# Patient Record
Sex: Male | Born: 1990 | Race: White | Hispanic: No | Marital: Single | Smoking: Former smoker
Health system: Southern US, Community
[De-identification: ages and names within clinical notes are randomized; demographics above are authoritative.]

## PROBLEM LIST (undated history)

## (undated) DIAGNOSIS — I10 Essential (primary) hypertension: Secondary | ICD-10-CM

---
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
## 2018-06-28 ENCOUNTER — Ambulatory Visit (HOSPITAL_COMMUNITY)
Admission: RE | Admit: 2018-06-28 | Discharge: 2018-06-28 | Disposition: A | Discharge status: Home / Self Care | Payer: Self-pay | Attending: Psychiatry | Admitting: Psychiatry

## 2018-06-28 DIAGNOSIS — Z1330 Encounter for screening examination for mental health and behavioral disorders, unspecified: Secondary | ICD-10-CM | POA: Insufficient documentation

## 2018-06-29 ENCOUNTER — Other Ambulatory Visit: Payer: Self-pay

## 2018-06-29 ENCOUNTER — Encounter (HOSPITAL_COMMUNITY): Payer: Self-pay | Admitting: *Deleted

## 2018-06-29 ENCOUNTER — Inpatient Hospital Stay (HOSPITAL_COMMUNITY)
Admission: AD | Admit: 2018-06-29 | Discharge: 2018-07-02 | DRG: 885 | Disposition: A | Discharge status: Home / Self Care | Payer: Federal, State, Local not specified - Other | Attending: Psychiatry | Admitting: Psychiatry

## 2018-06-29 DIAGNOSIS — F4310 Post-traumatic stress disorder, unspecified: Secondary | ICD-10-CM

## 2018-06-29 DIAGNOSIS — I10 Essential (primary) hypertension: Secondary | ICD-10-CM | POA: Diagnosis present

## 2018-06-29 DIAGNOSIS — Z23 Encounter for immunization: Secondary | ICD-10-CM

## 2018-06-29 DIAGNOSIS — Z915 Personal history of self-harm: Secondary | ICD-10-CM

## 2018-06-29 DIAGNOSIS — Z888 Allergy status to other drugs, medicaments and biological substances status: Secondary | ICD-10-CM | POA: Diagnosis not present

## 2018-06-29 DIAGNOSIS — F17210 Nicotine dependence, cigarettes, uncomplicated: Secondary | ICD-10-CM | POA: Diagnosis present

## 2018-06-29 DIAGNOSIS — F314 Bipolar disorder, current episode depressed, severe, without psychotic features: Secondary | ICD-10-CM | POA: Diagnosis present

## 2018-06-29 DIAGNOSIS — F1920 Other psychoactive substance dependence, uncomplicated: Secondary | ICD-10-CM | POA: Diagnosis not present

## 2018-06-29 DIAGNOSIS — R45851 Suicidal ideations: Secondary | ICD-10-CM | POA: Diagnosis present

## 2018-06-29 DIAGNOSIS — F419 Anxiety disorder, unspecified: Secondary | ICD-10-CM | POA: Diagnosis not present

## 2018-06-29 DIAGNOSIS — G4700 Insomnia, unspecified: Secondary | ICD-10-CM | POA: Diagnosis present

## 2018-06-29 LAB — URINALYSIS, ROUTINE W REFLEX MICROSCOPIC
BILIRUBIN URINE: NEGATIVE
Bacteria, UA: NONE SEEN
GLUCOSE, UA: NEGATIVE mg/dL
KETONES UR: NEGATIVE mg/dL
LEUKOCYTES UA: NEGATIVE
NITRITE: NEGATIVE
PH: 5 (ref 5.0–8.0)
Protein, ur: NEGATIVE mg/dL
Specific Gravity, Urine: 1.025 (ref 1.005–1.030)

## 2018-06-29 LAB — COMPREHENSIVE METABOLIC PANEL
ALK PHOS: 55 U/L (ref 38–126)
ALT: 32 U/L (ref 0–44)
AST: 32 U/L (ref 15–41)
Albumin: 3.8 g/dL (ref 3.5–5.0)
Anion gap: 8 (ref 5–15)
BUN: 9 mg/dL (ref 6–20)
CO2: 27 mmol/L (ref 22–32)
CREATININE: 0.66 mg/dL (ref 0.61–1.24)
Calcium: 9.3 mg/dL (ref 8.9–10.3)
Chloride: 106 mmol/L (ref 98–111)
Glucose, Bld: 104 mg/dL — ABNORMAL HIGH (ref 70–99)
Potassium: 3.8 mmol/L (ref 3.5–5.1)
Sodium: 141 mmol/L (ref 135–145)
Total Bilirubin: 0.7 mg/dL (ref 0.3–1.2)
Total Protein: 6.5 g/dL (ref 6.5–8.1)

## 2018-06-29 LAB — CBC
HCT: 48.3 % (ref 39.0–52.0)
Hemoglobin: 15.9 g/dL (ref 13.0–17.0)
MCH: 29.8 pg (ref 26.0–34.0)
MCHC: 32.9 g/dL (ref 30.0–36.0)
MCV: 90.4 fL (ref 80.0–100.0)
PLATELETS: 251 10*3/uL (ref 150–400)
RBC: 5.34 MIL/uL (ref 4.22–5.81)
RDW: 13 % (ref 11.5–15.5)
WBC: 8 10*3/uL (ref 4.0–10.5)
nRBC: 0 % (ref 0.0–0.2)

## 2018-06-29 LAB — HEMOGLOBIN A1C
Hgb A1c MFr Bld: 5.2 % (ref 4.8–5.6)
MEAN PLASMA GLUCOSE: 102.54 mg/dL

## 2018-06-29 LAB — LIPID PANEL
CHOLESTEROL: 145 mg/dL (ref 0–200)
HDL: 34 mg/dL — ABNORMAL LOW (ref >40)
LDL Cholesterol: 81 mg/dL (ref 0–99)
Total CHOL/HDL Ratio: 4.3 RATIO
Triglycerides: 149 mg/dL (ref <150)
VLDL: 30 mg/dL (ref 0–40)

## 2018-06-29 LAB — RAPID URINE DRUG SCREEN, HOSP PERFORMED
Amphetamines: NOT DETECTED
BARBITURATES: NOT DETECTED
BENZODIAZEPINES: NOT DETECTED
Cocaine: NOT DETECTED
Opiates: NOT DETECTED
Tetrahydrocannabinol: NOT DETECTED

## 2018-06-29 LAB — TSH: TSH: 3.053 u[IU]/mL (ref 0.350–4.500)

## 2018-06-29 MED ORDER — PNEUMOCOCCAL VAC POLYVALENT 25 MCG/0.5ML IJ INJ
0.5000 mL | INJECTION | INTRAMUSCULAR | Status: AC
  Administered 2018-06-30: 0.5 mL via INTRAMUSCULAR

## 2018-06-29 MED ORDER — NICOTINE POLACRILEX 2 MG MT GUM
2.0000 mg | CHEWING_GUM | OROMUCOSAL | Status: DC | PRN
  Administered 2018-06-29: 2 mg via ORAL
  Filled 2018-06-29: qty 1

## 2018-06-29 MED ORDER — MAGNESIUM HYDROXIDE 400 MG/5ML PO SUSP: 30.0000 mL | Freq: Every day | ORAL | Status: DC | PRN

## 2018-06-29 MED ORDER — HYDROXYZINE HCL 50 MG PO TABS
50.0000 mg | ORAL_TABLET | Freq: Four times a day (QID) | ORAL | Status: DC | PRN
  Administered 2018-06-29 (×2): 50 mg via ORAL
  Filled 2018-06-29: qty 1
  Filled 2018-06-29: qty 10
  Filled 2018-06-29: qty 1

## 2018-06-29 MED ORDER — ENSURE ENLIVE PO LIQD: 237.0000 mL | Freq: Two times a day (BID) | ORAL | Status: DC

## 2018-06-29 MED ORDER — ACETAMINOPHEN 325 MG PO TABS: 650.0000 mg | ORAL_TABLET | Freq: Four times a day (QID) | ORAL | Status: DC | PRN

## 2018-06-29 MED ORDER — ONDANSETRON HCL 4 MG PO TABS
4.0000 mg | ORAL_TABLET | Freq: Three times a day (TID) | ORAL | Status: DC | PRN
  Administered 2018-06-29: 4 mg via ORAL
  Filled 2018-06-29: qty 1

## 2018-06-29 MED ORDER — IBUPROFEN 600 MG PO TABS
600.0000 mg | ORAL_TABLET | Freq: Four times a day (QID) | ORAL | Status: DC | PRN
  Administered 2018-06-29 – 2018-06-30 (×3): 600 mg via ORAL
  Filled 2018-06-29 (×3): qty 1

## 2018-06-29 MED ORDER — HYDROXYZINE HCL 25 MG PO TABS: 25.0000 mg | ORAL_TABLET | Freq: Four times a day (QID) | ORAL | Status: DC | PRN

## 2018-06-29 MED ORDER — ALUM & MAG HYDROXIDE-SIMETH 200-200-20 MG/5ML PO SUSP: 30.0000 mL | ORAL | Status: DC | PRN

## 2018-06-29 MED ORDER — IBUPROFEN 400 MG PO TABS: 400.0000 mg | ORAL_TABLET | Freq: Four times a day (QID) | ORAL | Status: DC | PRN

## 2018-06-29 MED ORDER — NICOTINE 21 MG/24HR TD PT24
21.0000 mg | MEDICATED_PATCH | Freq: Every day | TRANSDERMAL | Status: DC
  Administered 2018-06-29 – 2018-07-01 (×3): 21 mg via TRANSDERMAL
  Filled 2018-06-29 (×5): qty 1

## 2018-06-29 MED ORDER — INFLUENZA VAC SPLIT QUAD 0.5 ML IM SUSY
0.5000 mL | PREFILLED_SYRINGE | INTRAMUSCULAR | Status: AC
  Administered 2018-06-30: 0.5 mL via INTRAMUSCULAR
  Filled 2018-06-29: qty 0.5

## 2018-06-29 MED ORDER — NALTREXONE HCL 50 MG PO TABS
50.0000 mg | ORAL_TABLET | Freq: Every day | ORAL | Status: DC
  Administered 2018-06-29 – 2018-07-02 (×4): 50 mg via ORAL
  Filled 2018-06-29 (×5): qty 1

## 2018-06-29 MED ORDER — QUETIAPINE FUMARATE 100 MG PO TABS
100.0000 mg | ORAL_TABLET | Freq: Every day | ORAL | Status: DC
  Administered 2018-06-29 (×2): 100 mg via ORAL
  Filled 2018-06-29 (×4): qty 1

## 2018-06-29 NOTE — [facility] Group Notes (Signed)
Nursing Adult Psychoeducational Group Note  Date:  06/29/2018 Time:  4:00 PM  Group Topic/Focus: Self-Care Self Care:   The focus of this group is to help patients understand the importance of self-care in order to improve or restore emotional, physical, spiritual, interpersonal, and financial health.  Participation Level:  Active  Participation Quality:  Attentive  Affect:  Flat  Cognitive:  Oriented  Insight: Improving  Engagement in Group:  Developing/Improving  Modes of Intervention:  Activity, Discussion, Education and Socialization  Additional Comments:  Patient was appropriate throughout group and attentive to peers. Patient contributed positively to the discussion and demonstrated good insight into well rounded self-care activities. Patient reports one self-care activity he will perform upon discharge is attending mental health support group meetings.   Rae Lipsmanda A Yuchen Fedor 06/29/2018, 4:45 PM

## 2018-06-29 NOTE — Progress Notes (Signed)
[facility] INPATIENT: Family/Significant Other Suicide Prevention Education ? Suicide Prevention Education:  Education Completed; with N.A. Sponsor, Nat MathMarisa Aton [phone]) has been identified by the patient as the family member/significant other with whom the patient will be residing, and identified as the person(s) who will aid the patient in the event of a mental health crisis (suicidal ideations/suicide attempt). With written consent from the patient, the family member/significant other has been provided the following suicide prevention education, prior to the and/or following the discharge of the patient. ? The suicide prevention education provided includes the following: Suicide risk factors  Suicide prevention and interventions  National Suicide Hotline telephone number  [facility] assessment telephone number  [facility] Emergency Assistance 911  [facility] and/or Residential Mobile Crisis Unit telephone number  Request made of family/significant other to: Remove weapons (e.g., guns, rifles, knives), all items previously/currently identified as safety concern.  Remove drugs/medications (over-the-counter, prescriptions, illicit drugs), all items previously/currently identified as a safety concern.  The family member/significant other verbalizes understanding of the suicide prevention education information provided. The family member/significant other agrees to remove the items of safety concern listed above.  CSW spoke with patient's N.A. Sponsor, Nolberto HanlonMarisa regarding patient and to complete suicide prevention education overview. Nolberto HanlonMarisa reports that she observed a notable decline in the patient's mental health in the past two weeks, stating the patient has been acting "in crisis mode" calling his sponsor multiple times throughout the day instead of his usual once a day check in. Nolberto HanlonMarisa has concerns that the patient has not consistently taken his psychiatric medications and thinks  this may contribute to his mental health decline. Nolberto HanlonMarisa cites a new relationship for the patient within the past two weeks, which has become "all consuming and full of drama."  Nolberto HanlonMarisa recognized a change the the patient's behavior and the patient spoke of passive SI, so she encouraged the patient to seek inpatient mental health treatment. Nolberto HanlonMarisa voices understanding of SPE and resources available for crisis including mobile crisis.  Nolberto HanlonMarisa states "I am concerned for his safety. He's in such a fragile place right now, I am concerned he may (relapse) and accidentally overdose or he may cut himself. He has a lot going on and has a lot of trauma in his past." Nolberto HanlonMarisa inquired about long term residential mental health treatment options for patient, CSW explained most patients discharge with outpatient follow up, but resources will be available to patient should he wish to pursue them.   Enid Cutterharlotte Crystin Lechtenberg, LCSW-A Clinical Social Worker

## 2018-06-29 NOTE — H&P (Signed)
Behavioral Health Medical Screening Exam  Nathan FickleMatthew W Philyaw is an 27 y.o. male [facility], endorsing worsening MDD sx, with SI/plan. He has a hx of polysubstance abuse. He denies any recent use of heroin. He denies any acute ailments.  Total Time spent with patient: 20 minutes  Psychiatric Specialty Exam: Physical Exam  Constitutional: He is oriented to person, place, and time. He appears well-developed and well-nourished. No distress.  HENT:  Head: Normocephalic.  Eyes: Pupils are equal, round, and reactive to light.  Respiratory: Effort normal and breath sounds normal. No respiratory distress.  Neurological: He is alert and oriented to person, place, and time. No cranial nerve deficit.  Skin: Skin is warm and dry. He is not diaphoretic.  Psychiatric: His speech is rapid and/or pressured. He is withdrawn. Cognition and memory are impaired. He expresses impulsivity. He exhibits a depressed mood. He expresses suicidal ideation. He expresses suicidal plans.    Review of Systems  Constitutional: Negative for chills, diaphoresis, fever, malaise/fatigue and weight loss.  Psychiatric/Behavioral: Positive for depression, substance abuse and suicidal ideas.    There were no vitals taken for this visit.There is no height or weight on file to calculate BMI.  General Appearance: Bizarre and Casual  Eye Contact:  Fair  Speech:  Pressured  Volume:  Increased  Mood:  Depressed  Affect:  Congruent  Thought Process:  Disorganized  Orientation:  Full (Time, Place, and Person)  Thought Content:  Logical  Suicidal Thoughts:  Yes.  with intent/plan  Homicidal Thoughts:  No  Memory:  Immediate;   Fair  Judgement:  Impaired  Insight:  Lacking  Psychomotor Activity:  Normal  Concentration: Concentration: Fair  Recall:  [employer]Fair  Fund of Knowledge:Fair  Language: Fair  Akathisia:  Negative  Handed:  Right  AIMS (if indicated):     Assets:  Desire for Improvement  Sleep:        Musculoskeletal: Strength & Muscle Tone: within normal limits Gait & Station: normal Patient leans: N/A  There were no vitals taken for this visit.  Recommendations:  Based on my evaluation the patient does not appear to have an emergency medical condition.  Kerry HoughSpencer E Aljean Horiuchi, PA-C 06/29/2018, 1:45 AM

## 2018-06-29 NOTE — Tx Team (Signed)
Initial Treatment Plan 06/29/2018 0130 Max FickleMatthew W Goodell ZOX:[id number]    PATIENT STRESSORS: Legal issue Substance abuse Other: feels medications aren't working   PATIENT STRENGTHS: Ability for insight Average or above average intelligence Capable of independent living MetallurgistCommunication skills Financial means General fund of knowledge Motivation for treatment/growth Physical Health Supportive family/friends Work skills   PATIENT IDENTIFIED PROBLEMS:   "To get on the right meds. That's really the main thing."                   DISCHARGE CRITERIA:  Improved stabilization in mood, thinking, and/or behavior Need for constant or close observation no longer present Reduction of life-threatening or endangering symptoms to within safe limits Verbal commitment to aftercare and medication compliance  PRELIMINARY DISCHARGE PLAN: Attend 12-step recovery group Return to previous living arrangement Return to previous work or school arrangements  PATIENT/FAMILY INVOLVEMENT: This treatment plan has been presented to and reviewed with the patient, Max FickleMatthew W Trew, and/or family member.  The patient and family have been given the opportunity to ask questions and make suggestions.  Lawrence MarseillesFriedman, Claudine Stallings Eakes, RN 06/29/2018, [phone]

## 2018-06-29 NOTE — Progress Notes (Signed)
The patient attended the evening A.A.meeting without any difficulty.
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
## 2018-06-29 NOTE — Progress Notes (Signed)
Pt was observed in the dayroom, attending AA meeting. Pt appears depressed in affect and mood. Pt denies SI/HI/AVH at this time. Rates pain 8/10; Generalized. Pt c/o of nausea this evening.Provider on call notified. PRN zofran, vistaril, and ibuprofen requested and given. Pt states he is going to return back [facility] once d/c. Pt is minimal with interaction.Support and encouragement offered. Will continue with POC.

## 2018-06-29 NOTE — [facility] Group Notes (Signed)
[facility] Mental Health Association Group Therapy 06/29/2018 1:15pm  Type of Therapy: Mental Health Association Presentation  Participation Level: Active  Participation Quality: Attentive  Affect: Appropriate  Cognitive: Oriented  Insight: Developing/Improving  Engagement in Therapy: Engaged  Modes of Intervention: Discussion, Education and Socialization  Summary of Progress/Problems: Mental Health Association (MHA) Speaker came to talk about his personal journey with mental health. The pt processed ways by which to relate to the speaker. MHA speaker provided handouts and educational information pertaining to groups and services offered by the MHA. Pt was engaged in speaker's presentation and was receptive to resources provided.    Nathan Morton S [name] [name], LCSW 06/29/2018 2:02 PM  
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
## 2018-06-29 NOTE — Plan of Care (Signed)
Problem: Activity: Goal: Interest or engagement in activities will improve Outcome: Progressing Goal: Sleeping patterns will improve Outcome: Progressing   Problem: Safety: Goal: Periods of time without injury will increase Outcome: Progressing   Problem: Medication: Goal: Compliance with prescribed medication regimen will improve Outcome: Progressing DAR Note: Pt visible in dayroom on initial approach.  A & O X4. Denies Endorsed passive SI, verbally contracts for safety "I can't do anything in here". Denies HI, AVH and pain when assessed. Per pt "my mood is all over the place". Observed in dayroom during groups and was engaged. Cooperative with unit routines. Tolerated EKG and all PO intake well. Reports anxiety for active withdrawals system. Emotional support and encouragement offered to pt throughout this shift. Scheduled and PRN medications given as ordered with verbal education and effects monitored. Safety checks maintained without self harm gestures or outburst to note thus far.  Pt receptive to care. Went off unit for activities, returned without issues. Compliant with medications when offered.  Denies side effects when assessed. POC for safety and mood stability.

## 2018-06-29 NOTE — ED Notes (Signed)
Donell SievertSimon Spencer, PA, patient meets inpatient criteria. Hassie BruceKim, AC, [facility]

## 2018-06-29 NOTE — [facility] Suicide Risk Assessment (Signed)
[facility]   Nursing information obtained from:  Patient, Review of record Demographic factors:  Male, Adolescent or young adult, Caucasian Current Mental Status:  Suicidal ideation indicated by patient, Suicide plan, Plan includes specific time, place, or method, Self-harm thoughts, Self-harm behaviors, Intention to act on suicide plan Loss Factors:  Legal issues Historical Factors:  Prior suicide attempts, Family history of mental illness or substance abuse, Impulsivity Risk Reduction Factors:  Sense of responsibility to family, Employed, Living with another person, especially a relative, Positive social support, Positive therapeutic relationship  Total Time spent with patient: 1 hour Principal Problem: Bipolar 1 disorder, depressed, severe (HCC) Diagnosis:  Principal Problem:   Bipolar 1 disorder, depressed, severe (HCC) Active Problems:   PTSD (post-traumatic stress disorder)   Polysubstance dependence (HCC)  Subjective Data: see H&P  Continued Clinical Symptoms:  Alcohol Use Disorder Identification Test Final Score (AUDIT): 8 The "Alcohol Use Disorders Identification Test", Guidelines for Use in Primary Care, Second Edition.  World Science writerHealth [facility](WHO). Score between 0-7:  no or low risk or alcohol related problems. Score between 8-15:  moderate risk of alcohol related problems. Score between 16-19:  high risk of alcohol related problems. Score 20 or above:  warrants further diagnostic evaluation for alcohol dependence and treatment.   CLINICAL FACTORS:   Severe Anxiety and/or Agitation Depression:   Comorbid alcohol abuse/dependence Impulsivity Alcohol/Substance Abuse/Dependencies More than one psychiatric diagnosis Unstable or Poor Therapeutic Relationship Previous Psychiatric Diagnoses and Treatments   Psychiatric Specialty Exam: Physical Exam  Nursing note and vitals reviewed.     Blood pressure 99/62, pulse (!) 111, temperature  97.6 F (36.4 C), temperature source Oral, resp. rate 18, height 5\' 10"  (1.778 m), weight 90.3 kg.Body mass index is 28.55 kg/m.     COGNITIVE FEATURES THAT CONTRIBUTE TO RISK:  None    SUICIDE RISK:   Moderate:  Frequent suicidal ideation with limited intensity, and duration, some specificity in terms of plans, no associated intent, good self-control, limited dysphoria/symptomatology, some risk factors present, and identifiable protective factors, including available and accessible social support.  PLAN OF CARE: see H&P  I certify that inpatient services furnished can reasonably be expected to improve the patient's condition.   Nathan Likenshristopher T Daniell Mancinas, MD 06/29/2018, 1:12 PM

## 2018-06-29 NOTE — BH Assessment (Signed)
Assessment Note  Nathan Morton is an 27 y.o. male presenting with SI with plan to overdose on heroin or mix Trazadone pills with alcohol. Hx of Bipolar, Depression, PTSD and Anxiety. Patient reported SI with plans for the past week. Patient reported [facility]. Patient reported 1st suicide attempt was in 7th grade with attempted overdose and last attempt was 3 years ago "I cut the crooks of my arms and wrist, I got over 100 stitches". Patient reported triggers of depression and SI are relationships, medication not working (specifically Trazadone), not feeling accepted, being so manic to where I spend everything and then struggle the rest of the week. Patient reported poor coping skills, including, hx of drug usage, body piercing's and tattoos because they cause pain. Patient reported currently residing at the [facility] for the past 1 month, stating "I am on good terms, they know I am here and are okay with me coming back". [facility] [place] [facility] [place] [facility] [place] [facility] [place] [facility]. Patient released 03/08/17, with last inpatient mental health stay being while he was in prison. Patient denied HI, psychosis, and drug/alcohol usage. Patient was calm and cooperative during assessment. Patient was alert and oriented x4. Patient speech was logical and coherent with fair eye contact. Patients mood was sad and depressed. Patient requesting to sign himself in voluntarily for inpatient mental health treatment.    Diagnosis: Major Depressive disorder, hx of bipolar, anxiety and PTSD  Past Medical History:   Family History: No family history on file.  Social History:  has no tobacco, alcohol, and drug history on file.  Additional Social History:  Alcohol / Drug Use Pain Medications: see MAR Prescriptions: see MAR Over the Counter: see MAR  CIWA:   COWS:    Allergies: Allergies not on file  Home Medications:  (Not in a hospital  admission)  OB/GYN Status:  No LMP for male patient.  General Assessment Data [facility] Assessment: In system Is this a Tele or Face-to-Face Assessment?: Face-to-Face Is this an Initial Assessment or a Re-assessment for this encounter?: Initial Assessment Language Other than English: No Living Arrangements: [employer]([facility]) What gender do you identify as?: Male Marital status: Single Living Arrangements: [employer]([facility]) Can pt return to current living arrangement?: Yes Admission Status: Voluntary Is patient capable of signing voluntary admission?: Yes Referral Source: Self/Family/Friend  Medical Screening Exam [facility] Walk-in ONLY) Medical Exam completed: Yes  Crisis Care Plan Living Arrangements: [facility]) Legal Guardian: (self) Name of Psychiatrist: Tyler Aas([facility]) Name of Therapist: (none)  Education Status Is patient currently in school?: No Is the patient employed, unemployed or receiving disability?: Employed  Risk to self with the past 6 months Suicidal Ideation: Yes-Currently Present Has patient been a risk to self within the past 6 months prior to admission? : Yes Suicidal Intent: Yes-Currently Present Has patient had any suicidal intent within the past 6 months prior to admission? : Yes Is patient at risk for suicide?: Yes Suicidal Plan?: Yes-Currently Present Has patient had any suicidal plan within the past 6 months prior to admission? : Yes Specify Current Suicidal Plan: (overdose on heroin or mix Trazadone with alcohol) Access to Means: Yes Specify Access to Suicidal Means: (contact heroin dealer, alcohol and Trazadone at home) What has been your use of drugs/alcohol within the last 12 months?: (heroin and alcohol) Previous Attempts/Gestures: Yes How many times?: (several) Other Self Harm Risks: (none)  Triggers for Past Attempts: Other personal contacts(diagnosis) Intentional Self Injurious Behavior:  None Family Suicide History: Yes(aunt, possible uncle and another distant relative) Recent stressful life event(s): (medication not working, manic, not being accepted relatinshi) Persecutory voices/beliefs?: No Depression: Yes Depression Symptoms: Guilt, Feeling worthless/self pity, Tearfulness, Feeling angry/irritable, Isolating, Fatigue, Insomnia Substance abuse history and/or treatment for substance abuse?: Yes(heroin and alcohol)  Risk to Others within the past 6 months Homicidal Ideation: No Does patient have any lifetime risk of violence toward others beyond the six months prior to admission? : No Thoughts of Harm to Others: No Current Homicidal Intent: No Current Homicidal Plan: No Access to Homicidal Means: No History of harm to others?: No Assessment of Violence: None Noted Does patient have access to weapons?: No Criminal Charges Pending?: No Does patient have a court date: No Is patient on probation?: No  Psychosis Hallucinations: None noted Delusions: None noted  Mental Status Report Appearance/Hygiene: Other (Comment)(facial tatoos and lip rings) Eye Contact: Fair Motor Activity: Freedom of movement Speech: Logical/coherent Level of Consciousness: Alert Mood: Helpless, Sad, Depressed Affect: Depressed, Sad Anxiety Level: Minimal Thought Processes: Coherent, Relevant Judgement: Impaired Orientation: Person, Place, Time, Situation Obsessive Compulsive Thoughts/Behaviors: None  Cognitive Functioning Concentration: Fair Memory: Recent Intact, Remote Intact Is patient IDD: No Insight: Fair Impulse Control: Fair Appetite: Poor Have you had any weight changes? : No Change Sleep: Decreased Total Hours of Sleep: (3-4) Vegetative Symptoms: Staying in bed  ADLScreening [facility] Assessment Services) Patient's cognitive ability adequate to safely complete daily activities?: Yes Patient able to express need for assistance with ADLs?: Yes Independently performs ADLs?:  Yes (appropriate for developmental age)  Prior Inpatient Therapy Prior Inpatient Therapy: Yes Prior Therapy Facilty/Provider(s): (presently at [facility]) Reason for Treatment: (depression, ptsd, bipolar and anxiety)  Prior Outpatient Therapy Prior Outpatient Therapy: No Does patient have an ACCT team?: No Does patient have Intensive In-House Services?  : No Does patient have [facility] services? : Yes Does patient have P4[facility] services?: No  ADL Screening (condition at time of admission) Patient's cognitive ability adequate to safely complete daily activities?: Yes Patient able to express need for assistance with ADLs?: Yes Independently performs ADLs?: Yes (appropriate for developmental age)      Disposition:  Disposition Initial Assessment Completed for this Encounter: Yes Disposition of Patient: Admit Type of inpatient treatment program: Adult  Donell Sievert, PA, patient meets inpatient criteria. Hassie Bruce, patient accepted to Room      .   On Site Evaluation by: Donell Sievert, PA   Burnetta Sabin, [facility] 06/29/2018 12:11 AM

## 2018-06-30 LAB — HIV ANTIBODY (ROUTINE TESTING W REFLEX): HIV Screen 4th Generation wRfx: NONREACTIVE

## 2018-06-30 LAB — PROLACTIN: Prolactin: 26.3 ng/mL — ABNORMAL HIGH (ref 4.0–15.2)

## 2018-06-30 MED ORDER — ENSURE ENLIVE PO LIQD
237.0000 mL | ORAL | Status: DC
  Administered 2018-06-30 – 2018-07-02 (×3): 237 mL via ORAL

## 2018-06-30 MED ORDER — QUETIAPINE FUMARATE 50 MG PO TABS
150.0000 mg | ORAL_TABLET | Freq: Every day | ORAL | Status: DC
  Administered 2018-06-30 – 2018-07-01 (×2): 150 mg via ORAL
  Filled 2018-06-30 (×3): qty 3

## 2018-06-30 MED ORDER — CYANOCOBALAMIN 500 MCG PO TABS
500.0000 ug | ORAL_TABLET | Freq: Every day | ORAL | Status: DC
  Administered 2018-06-30 – 2018-07-02 (×3): 500 ug via ORAL
  Filled 2018-06-30 (×4): qty 1

## 2018-06-30 MED ORDER — ADULT MULTIVITAMIN W/MINERALS CH
1.0000 | ORAL_TABLET | Freq: Every day | ORAL | Status: DC
  Administered 2018-06-30 – 2018-07-02 (×3): 1 via ORAL
  Filled 2018-06-30 (×4): qty 1

## 2018-06-30 NOTE — Progress Notes (Signed)
   06/30/18 0500  Sleep  Number of Hours 6.5   Goal met.

## 2018-06-30 NOTE — Progress Notes (Signed)
Patient ID: Max FickleMatthew W Lindaman, male   DOB: 18-Jun-1991, 27 y.o.   MRN: [id number]  D: Patient pleasant on approach tonight. Reports mood has improved since admission here. No active SI tonight. Attended AA group and participated. A: Staff will continue to monitor on q 15 minute checks, follow treatment plan, and give medications as ordered. R: Cooperative on the unit.

## 2018-06-30 NOTE — [facility] Group Notes (Signed)
LCSW Group Therapy Note  06/30/2018 1:15pm  Type of Therapy and Topic:  Group Therapy: Avoiding Self-Sabotaging and Enabling Behaviors  Participation Level:  Active   Description of Group:   In this group, patients will learn how to identify obstacles, self-sabotaging and enabling behaviors, as well as: what are they, why do we do them and what needs these behaviors meet. Discuss unhealthy relationships and how to have positive healthy boundaries with those that sabotage and enable. Explore aspects of self-sabotage and enabling in yourself and how to limit these self-destructive behaviors in everyday life.   Therapeutic Goals: 1. Patient will identify one obstacle that relates to self-sabotage and enabling behaviors 2. Patient will identify one personal self-sabotaging or enabling behavior they did prior to admission 3. Patient will state a plan to change the above identified behavior 4. Patient will demonstrate ability to communicate their needs through discussion and/or role play.   Summary of Patient Progress:  [name] was attentive and engaged during today's processing group. He shared that his biggest obstacle invovles managing mood swings and finding social support. [facility] at discharge. He plans to return to work on Monday and is hoping to continue getting "mentally better without having cravings. So far the meds are helping a lot." [name] continues to show progress in the group setting with improving insight.   Therapeutic Modalities:   Cognitive Behavioral Therapy Person-Centered Therapy Motivational Interviewing   Rona RavensHeather S Bellamia Ferch, [place] 06/30/2018 1:11 PM

## 2018-06-30 NOTE — [facility] Group Notes (Signed)
Adult Psychoeducational Group Note  Date:  06/30/2018 Time:  10:23 AM  Group Topic/Focus:  Orientation:   The focus of this group is to educate the patient on the purpose and policies of crisis stabilization and provide a format to answer questions about their admission.  The group details unit policies and expectations of patients while admitted.  Participation Level:  Active  Participation Quality:  Appropriate  Affect:  Appropriate  Cognitive:  Alert  Insight: Appropriate  Engagement in Group:  Engaged  Modes of Intervention:  Orientation  Additional Comments:  Pt attended and participated in psycho-ed group led by [facility] Caren C.    Dellia NimsJaquesha M Jasmine Morton 06/30/2018, 10:23 AM
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
## 2018-06-30 NOTE — Progress Notes (Signed)
DAR Note: Pt A & O X4. Awake in hall on initial approach. Denies SI, HI and AVH. Reports he's sleeping well with good appetite, good concentration and normal energy "I feel much better today, yesterday my mood was all over the place but today it's better". Rates his depression 2/10, hopelessness 2/10 and anxiety 1/10. Scheduled and PRN medications given per MD's orders with verbal education and effects monitored. Support offered to pt throughout this shift. Encouraged by Clinical research associatewriter to voice concerns, attend to ADLS and comply with current treatment regimen. Safety checks maintained without outburst or self harm gestures thus far.  Pt receptive to care. Cooperative with unit routines. Compliant with medications when offered. Denies side effects. Remains safe on and off unit.

## 2018-06-30 NOTE — Progress Notes (Signed)
NUTRITION ASSESSMENT  Pt identified as at risk on the Malnutrition Screen Tool  INTERVENTION: - Will order Ensure Enlive once/day, this supplement provides 350 kcal and 20 grams of protein. - Will order daily multivitamin with minerals.  - Continue to encourage PO intakes.   NUTRITION DIAGNOSIS: Unintentional weight loss related to sub-optimal intake as evidenced by pt report.   Goal: Pt to meet >/= 90% of their estimated nutrition needs.  Monitor:  PO intake  Assessment:  Patient admitted for worsening depression/MDD and has hx which includes bipolar disorder, PTSD, and polysubstance abuse. Patient had reported SI with a plan to OD on alcohol and trazodone. Patient had reported sobriety from alcohol and heroin for nearly 60 days.   Per chart review, current weight is 199 lb and [facility] This indicates 10 lb weight loss (5% body weight) in the past 1 month.     27 y.o. male  Height: Ht Readings from Last 1 Encounters:  06/29/18 5\' 10"  (1.778 m)    Weight: Wt Readings from Last 1 Encounters:  06/29/18 90.3 kg    Weight Hx: Wt Readings from Last 10 Encounters:  06/29/18 90.3 kg    BMI:  Body mass index is 28.55 kg/m. Pt meets criteria for overweight based on current BMI.  Estimated Nutritional Needs: Kcal: 25-30 kcal/kg Protein: > 1 gram protein/kg Fluid: 1 ml/kcal  Diet Order:  Diet Order            Diet regular Room service appropriate? Yes; Fluid consistency: Thin  Diet effective now             Pt is also offered choice of unit snacks mid-morning and mid-afternoon.  Pt is eating as desired.   Lab results and medications reviewed.     Trenton GammonJessica Coralynn Gaona, MS, RD, LDN, [facility] Inpatient Clinical Dietitian Pager # [phone] After hours/weekend pager # [phone]

## 2018-06-30 NOTE — Tx Team (Signed)
Interdisciplinary Treatment and Diagnostic Plan Update  06/30/2018 Time of Session: 8:30am Nathan Morton MRN: [id number]  Principal Diagnosis: Bipolar 1 disorder, depressed, severe (HCC)  Secondary Diagnoses: Principal Problem:   Bipolar 1 disorder, depressed, severe (HCC) Active Problems:   PTSD (post-traumatic stress disorder)   Polysubstance dependence (HCC)   Current Medications:  Current Facility-Administered Medications  Medication Dose Route Frequency Provider Last Rate Last Dose  . alum & mag hydroxide-simeth (MAALOX/MYLANTA) 200-200-20 MG/5ML suspension 30 mL  30 mL Oral Q4H PRN Donell SievertSimon, Spencer E, PA-C      . hydrOXYzine (ATARAX/VISTARIL) tablet 50 mg  50 mg Oral Q6H PRN Micheal Likensainville, Christopher T, MD   50 mg at 06/29/18 2126  . ibuprofen (ADVIL,MOTRIN) tablet 600 mg  600 mg Oral Q6H PRN Micheal Likensainville, Christopher T, MD   600 mg at 06/30/18 0820  . Influenza vac split quadrivalent PF (FLUARIX) injection 0.5 mL  0.5 mL Intramuscular Tomorrow-1000 Cobos, Fernando A, MD      . magnesium hydroxide (MILK OF MAGNESIA) suspension 30 mL  30 mL Oral Daily PRN Donell SievertSimon, Spencer E, PA-C      . naltrexone (DEPADE) tablet 50 mg  50 mg Oral Daily Jolyne Loaainville, Christopher T, MD   50 mg at 06/30/18 0819  . nicotine (NICODERM CQ - dosed in mg/24 hours) patch 21 mg  21 mg Transdermal Daily Micheal Likensainville, Christopher T, MD   21 mg at 06/30/18 [id number]  . ondansetron (ZOFRAN) tablet 4 mg  4 mg Oral Q8H PRN Nira ConnBerry, Jason A, NP   4 mg at 06/29/18 2127  . pneumococcal 23 valent vaccine (PNU-IMMUNE) injection 0.5 mL  0.5 mL Intramuscular Tomorrow-1000 Cobos, Fernando A, MD      . QUEtiapine (SEROQUEL) tablet 100 mg  100 mg Oral QHS Donell SievertSimon, Spencer E, PA-C   100 mg at 06/29/18 2127   PTA Medications: Medications Prior to Admission  Medication Sig Dispense Refill Last Dose  . cariprazine (VRAYLAR) capsule Take 1.5 mg by mouth at bedtime.   06/27/2018  . Cyanocobalamin (VITAMIN B12 PO) Take 1 tablet by mouth daily.    06/28/2018  . ibuprofen (ADVIL,MOTRIN) 200 MG tablet Take 200 mg by mouth every 6 (six) hours as needed (For pain.).   06/27/2018  . traZODone (DESYREL) 50 MG tablet Take 50 mg by mouth at bedtime as needed for sleep.   0 3 weeks ago    Patient Stressors: Legal issue Substance abuse Other: feels medications aren't working  Patient Strengths: Ability for insight Average or above average intelligence Capable of independent living MetallurgistCommunication skills Financial means General fund of knowledge Motivation for treatment/growth Physical Health Supportive family/friends Work skills  Treatment Modalities: Medication Management, Group therapy, Case management,  1 to 1 session with clinician, Psychoeducation, Recreational therapy.   Physician Treatment Plan for Primary Diagnosis: Bipolar 1 disorder, depressed, severe (HCC) Long Term Goal(s): Improvement in symptoms so as ready for discharge Improvement in symptoms so as ready for discharge   Short Term Goals: Ability to identify and develop effective coping behaviors will improve Ability to identify triggers associated with substance abuse/mental health issues will improve  Medication Management: Evaluate patient's response, side effects, and tolerance of medication regimen.  Therapeutic Interventions: 1 to 1 sessions, Unit Group sessions and Medication administration.  Evaluation of Outcomes: Progressing  Physician Treatment Plan for Secondary Diagnosis: Principal Problem:   Bipolar 1 disorder, depressed, severe (HCC) Active Problems:   PTSD (post-traumatic stress disorder)   Polysubstance dependence (HCC)  Long Term Goal(s): Improvement in symptoms  so as ready for discharge Improvement in symptoms so as ready for discharge   Short Term Goals: Ability to identify and develop effective coping behaviors will improve Ability to identify triggers associated with substance abuse/mental health issues will improve     Medication  Management: Evaluate patient's response, side effects, and tolerance of medication regimen.  Therapeutic Interventions: 1 to 1 sessions, Unit Group sessions and Medication administration.  Evaluation of Outcomes: Progressing   RN Treatment Plan for Primary Diagnosis: Bipolar 1 disorder, depressed, severe (HCC) Long Term Goal(s): Knowledge of disease and therapeutic regimen to maintain health will improve  Short Term Goals: Ability to remain free from injury will improve, Ability to verbalize frustration and anger appropriately will improve, Ability to demonstrate self-control, Ability to verbalize feelings will improve and Ability to identify and develop effective coping behaviors will improve  Medication Management: RN will administer medications as ordered by provider, will assess and evaluate patient's response and provide education to patient for prescribed medication. RN will report any adverse and/or side effects to prescribing provider.  Therapeutic Interventions: 1 on 1 counseling sessions, Psychoeducation, Medication administration, Evaluate responses to treatment, Monitor vital signs and CBGs as ordered, Perform/monitor CIWA, COWS, AIMS and Fall Risk screenings as ordered, Perform wound care treatments as ordered.  Evaluation of Outcomes: Progressing   LCSW Treatment Plan for Primary Diagnosis: Bipolar 1 disorder, depressed, severe (HCC) Long Term Goal(s): Safe transition to appropriate next level of care at discharge, Engage patient in therapeutic group addressing interpersonal concerns.  Short Term Goals: Engage patient in aftercare planning with referrals and resources, Increase social support, Increase emotional regulation, Facilitate patient progression through stages of change regarding substance use diagnoses and concerns, Identify triggers associated with mental health/substance abuse issues and Increase skills for wellness and recovery  Therapeutic Interventions: Assess for  all discharge needs, 1 to 1 time with Social worker, Explore available resources and support systems, Assess for adequacy in community support network, Educate family and significant other(s) on suicide prevention, Complete Psychosocial Assessment, Interpersonal group therapy.  Evaluation of Outcomes: Progressing   Progress in Treatment: Attending groups: Yes. Participating in groups: Yes. Taking medication as prescribed: Yes. Toleration medication: Yes. Family/Significant other contact made: Yes, individual(s) contacted:  NA Sponsor, Marisa Patient understands diagnosis: Yes. Discussing patient identified problems/goals with staff: Yes. Medical problems stabilized or resolved: Yes. Denies suicidal/homicidal ideation: Yes. Issues/concerns per patient self-inventory: Yes.  New problem(s) identified: CSW continuing to assess.  New Short Term/Long Term Goal(s): detox, medication management for mood stabilization; elimination of SI thoughts; development of comprehensive mental wellness/sobriety plan.  Patient Goals:  "Get on the right meds, get stable."  Discharge Plan or Barriers: Patient will follow up with [facility] for outpatient therapy and medication management and return to [facility].  MHAG pamphlet, Mobile Crisis information, and AA/NA information provided to patient for additional community support and resources. Per patient's request, CSW to provide mental health family support group information to patient prior to discharge.  Reason for Continuation of Hospitalization: Anxiety Depression Suicidal ideation Withdrawal symptoms  Estimated Length of Stay: 07/02/18  Attendees: Patient: Nathan Morton 06/30/2018 8:39 AM  Physician: Jake Michaelis 06/30/2018 8:39 AM  Nursing:  06/30/2018 8:39 AM  RN Care Manager: 06/30/2018 8:39 AM  Social Worker: Enid Cutter, CSW 06/30/2018 8:39 AM  Recreational Therapist:  06/30/2018 8:39 AM  Other: Loel Dubonnet 06/30/2018 8:39 AM  Other:   06/30/2018 8:39 AM  Other: 06/30/2018 8:39 AM    Scribe for Treatment Team: Darreld Mclean, LCSWA  06/30/2018 8:39 AM

## 2018-06-30 NOTE — Progress Notes (Signed)
Patient attended AA group meeting and participated.  

## 2018-06-30 NOTE — Progress Notes (Signed)
Recreation Therapy Notes  Date: 11.27.19 Time: 930 Location: 300 Hall Dayroom  Group Topic: Stress Management  Goal Area(s) Addresses:  Patient will verbalize importance of using healthy stress management.  Patient will identify positive emotions associated with healthy stress management.   Behavioral Response: Engaged  Intervention: Stress Management  Activity :  Meditation.  LRT introduced the stress management technique of meditation.  LRT played a meditation that guided patients to focus on the present and let go of things they can't change.  Patients were to follow along as the meditation played.  Education:  Stress Management, Discharge Planning.   Education Outcome: Acknowledges edcuation/In group clarification offered/Needs additional education  Clinical Observations/Feedback: Pt attended and participated in group.     Caroll RancherMarjette Josep Luviano, LRT/CTRS         Lillia AbedLindsay, Atonya Templer A 06/30/2018 10:59 AM

## 2018-07-01 LAB — HEPATITIS C ANTIBODY: HCV Ab: <0.1 {s_co_ratio} (ref 0.0–0.9)

## 2018-07-01 LAB — PROLACTIN: Prolactin: 21.8 ng/mL — ABNORMAL HIGH (ref 4.0–15.2)

## 2018-07-01 NOTE — [facility] Group Notes (Addendum)
[facility] Group Notes:  (Nursing/MHT/Case Management/Adjunct)  Date:  07/01/2018  Time:  10:17 PM  Type of Therapy:  Group Therapy  Participation Level:  Did Not Attend, patient notified of group and did not get out of bed  Participation Quality:    Affect:    Cognitive:    Insight:    Engagement in Group:    Modes of Intervention:    Summary of Progress/Problems:  Edwyna PerfectHeather C Estuardo Frisbee, RN 07/01/2018, 10:17 PM

## 2018-07-01 NOTE — Progress Notes (Signed)
DAR Note: Pt visible on phone in dayroom at this time. A & O X4. Denies SI, HI, AVH and pain. Pt states "I'm fine, I'm doing well, sleeping without problems, my appetite is good too". Remains medication compliant. Denies side effects. Attended morning AA groups and was engaged in discussions. All medications given with verbal education and effects monitored. Safety checks maintained without incident. Encouraged pt to voice concerns and comply with current treatment regimen.  Pt receptive to care. Cooperative with unit routines. Denies concerns at present. POC continues for safety and mood stability.
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
## 2018-07-01 NOTE — [facility] Group Notes (Deleted)
[place] Group Notes:  (Nursing/MHT/Case Management/Adjunct)  Date:  07/01/2018  Time:  10:02 PM  Type of Therapy:  Group Therapy  Participation Level:  Active  Participation Quality:  Appropriate and Sharing  Affect:  Anxious and Appropriate  Cognitive:  Alert, Appropriate and Oriented  Insight:  Limited  Engagement in Group:  Engaged  Modes of Intervention:  Discussion   Summary of Progress/Problems: Patient rated his day 6/10 because he had a good unexpected visit with his biological father. Patients goal today was to manage his anxiety but he felt his goal was not met. Patient rates his anxiety 10/10 and reports not being happy about his medication management.    Noel Christmas, RN 07/01/2018, 10:02 PM

## 2018-07-01 NOTE — [facility] Group Notes (Signed)
Adult Psychoeducational Group Note  Date:  07/01/2018 Time:  6:52 PM  Group Topic/Focus:  Making Healthy Choices:   The focus of this group is to help patients identify negative/unhealthy choices they were using prior to admission and identify positive/healthier coping strategies to replace them upon discharge.  Participation Level:  Active  Participation Quality:  Appropriate  Affect:  Appropriate  Cognitive:  Appropriate  Insight: Good  Engagement in Group:  Engaged  Modes of Intervention:  Education  Additional Comments: none  Gar GibbonMichael  Adore Kithcart 07/01/2018, 6:52 PM

## 2018-07-02 ENCOUNTER — Encounter: Payer: Self-pay | Admitting: Psychiatry

## 2018-07-02 LAB — GC/CHLAMYDIA PROBE AMP (~~LOC~~) NOT AT ARMC
Chlamydia: NEGATIVE
Neisseria Gonorrhea: NEGATIVE

## 2018-07-02 MED ORDER — HYDROXYZINE HCL 50 MG PO TABS: 50.0000 mg | ORAL_TABLET | Freq: Four times a day (QID) | ORAL | 0 refills | Status: DC | PRN

## 2018-07-02 MED ORDER — QUETIAPINE FUMARATE 100 MG PO TABS: 150.0000 mg | ORAL_TABLET | Freq: Every day | ORAL | 0 refills | Status: DC

## 2018-07-02 MED ORDER — NALTREXONE HCL 50 MG PO TABS: 50.0000 mg | ORAL_TABLET | Freq: Every day | ORAL | 0 refills | Status: DC

## 2018-07-02 NOTE — Progress Notes (Signed)
  [facility] Discharge Plan :  Will you be returning to the same living situation after discharge:  Yes,  patient reports he is returning home At discharge, do you have transportation home?: Yes,  patient reports he is being picked up by a friend at discharge Do you have the ability to pay for your medications: No.  Release of information consent forms completed and in the chart;  Patient's signature needed at discharge.  Patient to Follow up at: Follow-up Information    [facility] Follow up.   Specialty:  Behavioral Health Why:  Hospital follow-up on 12/4 at 8:00AM. Marcelino Duster(Michelle may not have been available this day but you will be seen). Thank you.  Contact information: [address] [place] [address] [phone]           Next level of care provider has access to [facility]:yes  Safety Planning and Suicide Prevention discussed: Yes,  with the patient's sponsor   Have you used any form of tobacco in the last 30 days? (Cigarettes, Smokeless Tobacco, Cigars, and/or Pipes): Yes  Has patient been referred to the Quitline?: Patient refused referral  Patient has been referred for addiction treatment: Pt. refused referral  Nathan SarahJolan E Lyndie Morton, LCSWA 07/02/2018, 10:04 AM

## 2018-07-02 NOTE — BHH Suicide Risk Assessment (Signed)
[facility]   Principal Problem: Bipolar 1 disorder, depressed, severe (HCC) Discharge Diagnoses: Principal Problem:   Bipolar 1 disorder, depressed, severe (HCC) Active Problems:   PTSD (post-traumatic stress disorder)   Polysubstance dependence (HCC)   Total Time spent with patient: 30 minutes  Psychiatric Specialty Exam:   Blood pressure 123/84, pulse 88, temperature 97.8 F (36.6 C), resp. rate 16, height 5\' 10"  (1.778 m), weight 90.3 kg.Body mass index is 28.55 kg/m.   Mental Status Per Nursing Assessment::   On Admission:  Suicidal ideation indicated by patient, Suicide plan, Plan includes specific time, place, or method, Self-harm thoughts, Self-harm behaviors, Intention to act on suicide plan  Demographic Factors:  Male, Adolescent or young adult, Caucasian, Low socioeconomic status and Unemployed  Loss Factors: Decrease in vocational status, Legal issues and Financial problems/change in socioeconomic status  Historical Factors: Impulsivity  Risk Reduction Factors:   Positive social support, Positive therapeutic relationship and Positive coping skills or problem solving skills  Continued Clinical Symptoms:  Severe Anxiety and/or Agitation Bipolar Disorder:   Depressive phase Alcohol/Substance Abuse/Dependencies  Cognitive Features That Contribute To Risk:  None    Suicide Risk:  Minimal: No identifiable suicidal ideation.  Patients presenting with no risk factors but with morbid ruminations; may be classified as minimal risk based on the severity of the depressive symptoms  Follow-up Information    [facility] Follow up.   Specialty:  Behavioral Health Why:  Hospital follow-up on 12/4 at 8:00AM. Marcelino Duster(Michelle may not have been available this day but you will be seen). Thank you.  Contact information: [address] [place] [address] [phone]           Plan Of Care/Follow-up recommendations:  Activity:  as tolerated Diet:   normal Tests:  NA Other:  see above for DC plan  Micheal Likenshristopher T Kodey Xue, MD 07/02/2018, 8:25 AM
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
## 2018-07-02 NOTE — Progress Notes (Signed)
Nursing Progress Note: 7p-7a D: Pt currently presents with a anxious affect and behavior. Interacting minimally with the milieu. Pt reports good3 sleep during the previous night with current medication regimen. Pt did not attend wrap-up group.  A: Pt provided with medications per providers orders. Pt's labs and vitals were monitored throughout the night. Pt supported emotionally and encouraged to express concerns and questions. Pt educated on medications.  R: Pt's safety ensured with 15 minute and environmental checks. Pt currently denies SI, HI, and AVH. Pt verbally contracts to seek staff if SI,HI, or AVH occurs and to consult with staff before acting on any harmful thoughts. Will continue to monitor.

## 2018-07-02 NOTE — Progress Notes (Signed)
Pt received both written and verbal discharge instruction. Pt verbalized understanding of discharge instructions. Pt agreed to f/u appt and med regimen. Pt received SRA, AVS, transitional record, suicide prevention, prescriptions and sample meds. Pt gathered belongings from room and locker. Pt safely discharged to the lobby.

## 2018-07-11 ENCOUNTER — Encounter (HOSPITAL_COMMUNITY): Payer: Self-pay | Admitting: Emergency Medicine

## 2018-07-11 ENCOUNTER — Other Ambulatory Visit: Payer: Self-pay

## 2018-07-11 ENCOUNTER — Inpatient Hospital Stay (HOSPITAL_COMMUNITY)
Admission: EM | Admit: 2018-07-11 | Discharge: 2018-07-16 | DRG: 917 | Disposition: A | Discharge status: Home / Self Care | Payer: Self-pay | Attending: Internal Medicine | Admitting: Internal Medicine

## 2018-07-11 DIAGNOSIS — R319 Hematuria, unspecified: Secondary | ICD-10-CM | POA: Diagnosis present

## 2018-07-11 DIAGNOSIS — F314 Bipolar disorder, current episode depressed, severe, without psychotic features: Secondary | ICD-10-CM | POA: Diagnosis present

## 2018-07-11 DIAGNOSIS — Z915 Personal history of self-harm: Secondary | ICD-10-CM

## 2018-07-11 DIAGNOSIS — T401X1A Poisoning by heroin, accidental (unintentional), initial encounter: Secondary | ICD-10-CM

## 2018-07-11 DIAGNOSIS — F4310 Post-traumatic stress disorder, unspecified: Secondary | ICD-10-CM | POA: Diagnosis present

## 2018-07-11 DIAGNOSIS — Z888 Allergy status to other drugs, medicaments and biological substances status: Secondary | ICD-10-CM

## 2018-07-11 DIAGNOSIS — T50901A Poisoning by unspecified drugs, medicaments and biological substances, accidental (unintentional), initial encounter: Secondary | ICD-10-CM

## 2018-07-11 DIAGNOSIS — M6282 Rhabdomyolysis: Secondary | ICD-10-CM | POA: Diagnosis present

## 2018-07-11 DIAGNOSIS — A419 Sepsis, unspecified organism: Secondary | ICD-10-CM | POA: Diagnosis present

## 2018-07-11 DIAGNOSIS — J320 Chronic maxillary sinusitis: Secondary | ICD-10-CM | POA: Diagnosis present

## 2018-07-11 DIAGNOSIS — F1920 Other psychoactive substance dependence, uncomplicated: Secondary | ICD-10-CM | POA: Diagnosis present

## 2018-07-11 DIAGNOSIS — T401X2A Poisoning by heroin, intentional self-harm, initial encounter: Principal | ICD-10-CM | POA: Diagnosis present

## 2018-07-11 DIAGNOSIS — N179 Acute kidney failure, unspecified: Secondary | ICD-10-CM | POA: Diagnosis present

## 2018-07-11 DIAGNOSIS — R739 Hyperglycemia, unspecified: Secondary | ICD-10-CM | POA: Diagnosis present

## 2018-07-11 DIAGNOSIS — G4700 Insomnia, unspecified: Secondary | ICD-10-CM | POA: Diagnosis present

## 2018-07-11 DIAGNOSIS — R945 Abnormal results of liver function studies: Secondary | ICD-10-CM

## 2018-07-11 DIAGNOSIS — R042 Hemoptysis: Secondary | ICD-10-CM | POA: Diagnosis present

## 2018-07-11 DIAGNOSIS — Z62810 Personal history of physical and sexual abuse in childhood: Secondary | ICD-10-CM | POA: Diagnosis present

## 2018-07-11 DIAGNOSIS — J189 Pneumonia, unspecified organism: Secondary | ICD-10-CM | POA: Diagnosis present

## 2018-07-11 DIAGNOSIS — S27329A Contusion of lung, unspecified, initial encounter: Secondary | ICD-10-CM | POA: Diagnosis present

## 2018-07-11 DIAGNOSIS — R7989 Other specified abnormal findings of blood chemistry: Secondary | ICD-10-CM

## 2018-07-11 DIAGNOSIS — R0602 Shortness of breath: Secondary | ICD-10-CM

## 2018-07-11 DIAGNOSIS — T1491XA Suicide attempt, initial encounter: Secondary | ICD-10-CM

## 2018-07-11 DIAGNOSIS — I10 Essential (primary) hypertension: Secondary | ICD-10-CM | POA: Diagnosis present

## 2018-07-11 DIAGNOSIS — W1830XA Fall on same level, unspecified, initial encounter: Secondary | ICD-10-CM | POA: Diagnosis present

## 2018-07-11 DIAGNOSIS — R45851 Suicidal ideations: Secondary | ICD-10-CM

## 2018-07-11 DIAGNOSIS — F17210 Nicotine dependence, cigarettes, uncomplicated: Secondary | ICD-10-CM | POA: Diagnosis present

## 2018-07-11 LAB — CBC WITH DIFFERENTIAL/PLATELET
BAND NEUTROPHILS: 8 %
BLASTS: 0 %
Basophils Absolute: 0 10*3/uL (ref 0.0–0.1)
Basophils Relative: 0 %
EOS ABS: 0 10*3/uL (ref 0.0–0.5)
Eosinophils Relative: 0 %
HEMATOCRIT: 56.3 % — AB (ref 39.0–52.0)
HEMOGLOBIN: 17.9 g/dL — AB (ref 13.0–17.0)
LYMPHS PCT: 4 %
Lymphs Abs: 1.8 10*3/uL (ref 0.7–4.0)
MCH: 29.8 pg (ref 26.0–34.0)
MCHC: 31.8 g/dL (ref 30.0–36.0)
MCV: 93.7 fL (ref 80.0–100.0)
Metamyelocytes Relative: 1 %
Monocytes Absolute: 0.4 10*3/uL (ref 0.1–1.0)
Monocytes Relative: 1 %
Myelocytes: 0 %
NEUTROS PCT: 86 %
NRBC: 0 % (ref 0.0–0.2)
NRBC: 0 /100{WBCs}
Neutro Abs: 41.8 10*3/uL — ABNORMAL HIGH (ref 1.7–7.7)
Other: 0 %
PROMYELOCYTES RELATIVE: 0 %
Platelets: 332 10*3/uL (ref 150–400)
RBC: 6.01 MIL/uL — AB (ref 4.22–5.81)
RDW: 13 % (ref 11.5–15.5)
WBC: 44 10*3/uL — AB (ref 4.0–10.5)

## 2018-07-11 LAB — ETHANOL

## 2018-07-11 LAB — COMPREHENSIVE METABOLIC PANEL
ALT: 32 U/L (ref 0–44)
ANION GAP: 22 — AB (ref 5–15)
AST: 40 U/L (ref 15–41)
Albumin: 4.9 g/dL (ref 3.5–5.0)
Alkaline Phosphatase: 87 U/L (ref 38–126)
BILIRUBIN TOTAL: 1 mg/dL (ref 0.3–1.2)
BUN: 13 mg/dL (ref 6–20)
CO2: 22 mmol/L (ref 22–32)
Calcium: 9.1 mg/dL (ref 8.9–10.3)
Chloride: 98 mmol/L (ref 98–111)
Creatinine, Ser: 1.53 mg/dL — ABNORMAL HIGH (ref 0.61–1.24)
Glucose, Bld: 209 mg/dL — ABNORMAL HIGH (ref 70–99)
POTASSIUM: 3.9 mmol/L (ref 3.5–5.1)
Sodium: 142 mmol/L (ref 135–145)
TOTAL PROTEIN: 8.3 g/dL — AB (ref 6.5–8.1)

## 2018-07-11 MED ORDER — NALOXONE HCL 2 MG/2ML IJ SOSY
1.0000 mg | PREFILLED_SYRINGE | Freq: Once | INTRAMUSCULAR | Status: AC
  Administered 2018-07-11: 1 mg via INTRAVENOUS
  Filled 2018-07-11: qty 2

## 2018-07-11 MED ORDER — SODIUM CHLORIDE 0.9 % IV BOLUS
1000.0000 mL | Freq: Once | INTRAVENOUS | Status: AC
  Administered 2018-07-11: 1000 mL via INTRAVENOUS

## 2018-07-11 MED ORDER — SODIUM CHLORIDE 0.9 % IV BOLUS
1000.0000 mL | Freq: Once | INTRAVENOUS | Status: AC
  Administered 2018-07-12: 1000 mL via INTRAVENOUS

## 2018-07-11 NOTE — ED Notes (Signed)
Bed: WA25 Expected date:  Expected time:  Means of arrival:  Comments: 

## 2018-07-11 NOTE — ED Triage Notes (Signed)
Patient reports "I think I overdosed on heroin." I woke up on the concrete. Unknown down time. C/o bilateral hip pain and abdominal pain. Emesis noted on clothes.

## 2018-07-11 NOTE — ED Provider Notes (Addendum)
[facility]-EMERGENCY DEPT Provider Note   CSN: [id number] Arrival date & time: 07/11/18  2154     History   Chief Complaint Chief Complaint  Patient presents with  . Loss of Consciousness    HPI Nathan Morton is a 27 y.o. male.  HPI Patient reports he was using heroin and he lost consciousness.  He woke up on the concrete.  He believes he overdosed.  He reports he was injecting heroin.  He reports he was clean and then he relapsed yesterday.  Ports he has some pain in his hips and lower abdomen.  Says it hurts a lot in his lower back on the left side along his buttock and radiates into his leg.  He reports that he was behind a building in a seated position and he is not sure how long he was back there but maybe 2 hours.  He did not fall as far as he is aware of.  He reports he felt well before this overdose episode.  He has not been sick recently.  He has not had fever chills or cough.  He has not had any abscess areas.  When he woke up he called a friend to come and get him.  Patient reports he also wants to get admitted to the psychiatric unit for treatment.  He reports he feels suicidal but he wants to live. Past Medical History:  Diagnosis Date  . Hypertension     Patient Active Problem List   Diagnosis Date Noted  . Bipolar 1 disorder, depressed, severe (HCC) 06/29/2018  . PTSD (post-traumatic stress disorder) 06/29/2018  . Polysubstance dependence (HCC) 06/29/2018    History reviewed. No pertinent surgical history.      Home Medications    Prior to Admission medications   Medication Sig Start Date End Date Taking? Authorizing Provider  hydrOXYzine (ATARAX/VISTARIL) 50 MG tablet Take 1 tablet (50 mg total) by mouth every 6 (six) hours as needed for anxiety. 07/02/18  Yes [place], Burlene Arnt, MD  naltrexone (DEPADE) 50 MG tablet Take 1 tablet (50 mg total) by mouth daily. 07/03/18  Yes Micheal Likens, MD  QUEtiapine (SEROQUEL) 100  MG tablet Take 1.5 tablets (150 mg total) by mouth at bedtime. 07/02/18  Yes Micheal Likens, MD    Family History No family history on file.  Social History Social History   Tobacco Use  . Smoking status: Current Every Day Smoker    Packs/day: 1.00    Types: Cigarettes  . Smokeless tobacco: Never Used  Substance Use Topics  . Alcohol use: Not Currently  . Drug use: Not Currently     Allergies   Adderall [amphetamine-dextroamphetamine]   Review of Systems Review of Systems 10 Systems reviewed and are negative for acute change except as noted in the HPI.  Physical Exam Updated Vital Signs BP 104/73 (BP Location: Right Arm)   Pulse (!) 150   Temp 98.5 F (36.9 C) (Oral)   Resp 17   Ht 5\' 10"  (1.778 m)   Wt 88.5 kg   SpO2 96%   BMI 27.98 kg/m   Physical Exam  Constitutional: He is oriented to person, place, and time.  Patient is resting with eyes closed but immediately opens to vocal stimulus.  Does not have respiratory distress.  HENT:  Airway is patent.  No pooling secretions.  Extensive tattoos on the face.  Eyes:  Pupils are 2 mm symmetric and responsive.  Extraocular motions are intact.  Neck:  Neck supple.  Cardiovascular:  Tachycardia.  No gross rub murmur gallop.  Distal pulses intact.  Pulmonary/Chest: Effort normal and breath sounds normal.  Abdominal: Soft. He exhibits no distension. There is no tenderness. There is no guarding.  Musculoskeletal: Normal range of motion. He exhibits no edema, tenderness or deformity.  No visible contusions or abrasions to the patient's back or buttocks.  He endorses pain at the lower back on the left.  Some pain in the buttock.  Lower extremities are symmetric in appearance.  Pain with range of motion of the left leg.  2+ dorsalis pedis pulses bilaterally.  Neurological: He is alert and oriented to person, place, and time. No cranial nerve deficit. He exhibits normal muscle tone. Coordination normal.  Skin: Skin  is warm and dry.     ED Treatments / Results  Labs (all labs ordered are listed, but only abnormal results are displayed) Labs Reviewed  COMPREHENSIVE METABOLIC PANEL - Abnormal; Notable for the following components:      Result Value   Glucose, Bld 209 (*)    Creatinine, Ser 1.53 (*)    Total Protein 8.3 (*)    Anion gap 22 (*)    All other components within normal limits  CBC WITH DIFFERENTIAL/PLATELET - Abnormal; Notable for the following components:   WBC 44.0 (*)    RBC 6.01 (*)    Hemoglobin 17.9 (*)    HCT 56.3 (*)    Neutro Abs 41.8 (*)    All other components within normal limits  CULTURE, BLOOD (ROUTINE X 2)  CULTURE, BLOOD (ROUTINE X 2)  ETHANOL  RAPID URINE DRUG SCREEN, HOSP PERFORMED  CK  URINALYSIS, ROUTINE W REFLEX MICROSCOPIC  I-STAT CG4 LACTIC ACID, ED    EKG None MUSE is not importing the EKG.  Sinus tachycardia.  No acute ischemic changes.  Radiology No results found.  Procedures Procedures (including critical care time) CRITICAL CARE Performed by: Arby Barrette   Total critical care time: 30 minutes  Critical care time was exclusive of separately billable procedures and treating other patients.  Critical care was necessary to treat or prevent imminent or life-threatening deterioration.  Critical care was time spent personally by me on the following activities: development of treatment plan with patient and/or surrogate as well as nursing, discussions with consultants, evaluation of patient's response to treatment, examination of patient, obtaining history from patient or surrogate, ordering and performing treatments and interventions, ordering and review of laboratory studies, ordering and review of radiographic studies, pulse oximetry and re-evaluation of patient's condition. Medications Ordered in ED Medications  sodium chloride 0.9 % bolus 1,000 mL (has no administration in time range)  sodium chloride 0.9 % bolus 1,000 mL (1,000 mLs  Intravenous New Bag/Given 07/11/18 2227)  naloxone [facility]) injection 1 mg (1 mg Intravenous Given 07/11/18 2229)     Initial Impression / Assessment and Plan / ED Course  I have reviewed the triage vital signs and the nursing notes.  Pertinent labs & imaging results that were available during my care of the patient were reviewed by me and considered in my medical decision making (see chart for details).    Patient had unintentional heroin overdose.  He reports he had been clean and then started relapsing yesterday.  Patient awakens to voice.  He however falls back asleep without stimulus.  He is maintaining oxygen saturation.  Pupils are pinpoint.  Patient is given Narcan 1 mg.  On recheck, patient remains awake.  He is not somnolent.  Continues to report he has severe pain in his left lower back radiating to his leg.  White count has returned with significant leukocytosis.  Review of systems is negative before this event.  Unclear if this is stress to margination or other source of leukocytosis.  We will continue hydration.  Will proceed with CT scan of chest abdomen pelvis.  Unclear if patient may have had fall although he reported being in a seated position.  Possible rhabdo will add a CK.  Patient will need follow-up of diagnostic studies and recheck of white count after hydration.  Final disposition will be per results in Dr. Nelda MarseilleWickline's reevaluation.  Final Clinical Impressions(s) / ED Diagnoses   Final diagnoses:  Accidental overdose of heroin, initial encounter [facility](HCC)  Suicidal ideation    ED Discharge Orders    None       Arby BarrettePfeiffer, Jackelyn Illingworth, MD 07/12/18 0000    Arby BarrettePfeiffer, Mattis Featherly, MD 07/12/18 0004

## 2018-07-11 NOTE — ED Notes (Signed)
Pt stated that his overdose was a suicidal attempt.

## 2018-07-12 ENCOUNTER — Other Ambulatory Visit: Payer: Self-pay

## 2018-07-12 ENCOUNTER — Emergency Department (HOSPITAL_COMMUNITY): Payer: Self-pay

## 2018-07-12 ENCOUNTER — Inpatient Hospital Stay (HOSPITAL_COMMUNITY): Payer: Self-pay

## 2018-07-12 ENCOUNTER — Encounter (HOSPITAL_COMMUNITY): Payer: Self-pay

## 2018-07-12 DIAGNOSIS — A419 Sepsis, unspecified organism: Secondary | ICD-10-CM | POA: Diagnosis present

## 2018-07-12 DIAGNOSIS — F1920 Other psychoactive substance dependence, uncomplicated: Secondary | ICD-10-CM

## 2018-07-12 DIAGNOSIS — T1491XA Suicide attempt, initial encounter: Secondary | ICD-10-CM

## 2018-07-12 DIAGNOSIS — T401X2A Poisoning by heroin, intentional self-harm, initial encounter: Principal | ICD-10-CM

## 2018-07-12 DIAGNOSIS — R6520 Severe sepsis without septic shock: Secondary | ICD-10-CM

## 2018-07-12 DIAGNOSIS — F314 Bipolar disorder, current episode depressed, severe, without psychotic features: Secondary | ICD-10-CM

## 2018-07-12 DIAGNOSIS — J189 Pneumonia, unspecified organism: Secondary | ICD-10-CM | POA: Diagnosis present

## 2018-07-12 DIAGNOSIS — N179 Acute kidney failure, unspecified: Secondary | ICD-10-CM

## 2018-07-12 LAB — HEMOGLOBIN A1C
HEMOGLOBIN A1C: 5.3 % (ref 4.8–5.6)
Mean Plasma Glucose: 105.41 mg/dL

## 2018-07-12 LAB — CBC WITH DIFFERENTIAL/PLATELET
ABS IMMATURE GRANULOCYTES: 0.14 10*3/uL — AB (ref 0.00–0.07)
Basophils Absolute: 0 10*3/uL (ref 0.0–0.1)
Basophils Relative: 0 %
Eosinophils Absolute: 0 10*3/uL (ref 0.0–0.5)
Eosinophils Relative: 0 %
HCT: 41.4 % (ref 39.0–52.0)
Hemoglobin: 13.2 g/dL (ref 13.0–17.0)
Immature Granulocytes: 1 %
Lymphocytes Relative: 9 %
Lymphs Abs: 1.6 10*3/uL (ref 0.7–4.0)
MCH: 29.7 pg (ref 26.0–34.0)
MCHC: 31.9 g/dL (ref 30.0–36.0)
MCV: 93 fL (ref 80.0–100.0)
Monocytes Absolute: 1 10*3/uL (ref 0.1–1.0)
Monocytes Relative: 6 %
Neutro Abs: 14.7 10*3/uL — ABNORMAL HIGH (ref 1.7–7.7)
Neutrophils Relative %: 84 %
Platelets: 214 10*3/uL (ref 150–400)
RBC: 4.45 MIL/uL (ref 4.22–5.81)
RDW: 13 % (ref 11.5–15.5)
WBC: 17.4 10*3/uL — ABNORMAL HIGH (ref 4.0–10.5)
nRBC: 0 % (ref 0.0–0.2)

## 2018-07-12 LAB — EXPECTORATED SPUTUM ASSESSMENT W GRAM STAIN, RFLX TO RESP C

## 2018-07-12 LAB — HEPATIC FUNCTION PANEL
ALT: 35 U/L (ref 0–44)
AST: 101 U/L — ABNORMAL HIGH (ref 15–41)
Albumin: 3.1 g/dL — ABNORMAL LOW (ref 3.5–5.0)
Alkaline Phosphatase: 50 U/L (ref 38–126)
Bilirubin, Direct: 0.2 mg/dL (ref 0.0–0.2)
Indirect Bilirubin: 1 mg/dL — ABNORMAL HIGH (ref 0.3–0.9)
TOTAL PROTEIN: 5.2 g/dL — AB (ref 6.5–8.1)
Total Bilirubin: 1.2 mg/dL (ref 0.3–1.2)

## 2018-07-12 LAB — ACETAMINOPHEN LEVEL: Acetaminophen (Tylenol), Serum: <10 ug/mL — ABNORMAL LOW (ref 10–30)

## 2018-07-12 LAB — TROPONIN I
Troponin I: 0.28 ng/mL (ref <0.03)
Troponin I: 0.21 ng/mL (ref <0.03)
Troponin I: 0.23 ng/mL (ref <0.03)
Troponin I: 0.19 ng/mL (ref <0.03)

## 2018-07-12 LAB — CK
CK TOTAL: 174 U/L (ref 49–397)
Total CK: 3491 U/L — ABNORMAL HIGH (ref 49–397)

## 2018-07-12 LAB — URINALYSIS, ROUTINE W REFLEX MICROSCOPIC
BILIRUBIN URINE: NEGATIVE
Cellular Cast, UA: 1
Glucose, UA: NEGATIVE mg/dL
Ketones, ur: 20 mg/dL — AB
Nitrite: NEGATIVE
PROTEIN: 100 mg/dL — AB
RBC / HPF: >50 RBC/hpf — ABNORMAL HIGH (ref 0–5)
Specific Gravity, Urine: 1.028 (ref 1.005–1.030)
pH: 5 (ref 5.0–8.0)

## 2018-07-12 LAB — SALICYLATE LEVEL: Salicylate Lvl: <7 mg/dL (ref 2.8–30.0)

## 2018-07-12 LAB — BASIC METABOLIC PANEL
Anion gap: 6 (ref 5–15)
BUN: 12 mg/dL (ref 6–20)
CO2: 28 mmol/L (ref 22–32)
Calcium: 7.4 mg/dL — ABNORMAL LOW (ref 8.9–10.3)
Chloride: 104 mmol/L (ref 98–111)
Creatinine, Ser: 0.99 mg/dL (ref 0.61–1.24)
GFR calc Af Amer: >60 mL/min (ref >60)
GFR calc non Af Amer: >60 mL/min (ref >60)
Glucose, Bld: 98 mg/dL (ref 70–99)
Potassium: 4.8 mmol/L (ref 3.5–5.1)
Sodium: 138 mmol/L (ref 135–145)

## 2018-07-12 LAB — I-STAT CG4 LACTIC ACID, ED: Lactic Acid, Venous: 2.04 mmol/L (ref 0.5–1.9)

## 2018-07-12 LAB — PROCALCITONIN: Procalcitonin: 5.43 ng/mL

## 2018-07-12 LAB — LACTIC ACID, PLASMA
Lactic Acid, Venous: 1.2 mmol/L (ref 0.5–1.9)
Lactic Acid, Venous: 1.4 mmol/L (ref 0.5–1.9)

## 2018-07-12 LAB — PROTIME-INR
INR: 1.12
Prothrombin Time: 14.3 seconds (ref 11.4–15.2)

## 2018-07-12 LAB — STREP PNEUMONIAE URINARY ANTIGEN: Strep Pneumo Urinary Antigen: NEGATIVE

## 2018-07-12 LAB — EXPECTORATED SPUTUM ASSESSMENT W REFEX TO RESP CULTURE

## 2018-07-12 LAB — MRSA PCR SCREENING: MRSA by PCR: NEGATIVE

## 2018-07-12 MED ORDER — IOPAMIDOL (ISOVUE-300) INJECTION 61%
100.0000 mL | Freq: Once | INTRAVENOUS | Status: AC | PRN
  Administered 2018-07-12: 100 mL via INTRAVENOUS

## 2018-07-12 MED ORDER — KETOROLAC TROMETHAMINE 30 MG/ML IJ SOLN: 30.0000 mg | Freq: Once | INTRAMUSCULAR | Status: DC

## 2018-07-12 MED ORDER — SODIUM CHLORIDE 0.9 % IV BOLUS (SEPSIS)
1000.0000 mL | Freq: Once | INTRAVENOUS | Status: AC
  Administered 2018-07-12: 1000 mL via INTRAVENOUS

## 2018-07-12 MED ORDER — SODIUM CHLORIDE 0.9 % IV SOLN
500.0000 mg | INTRAVENOUS | Status: DC
  Administered 2018-07-13 – 2018-07-14 (×2): 500 mg via INTRAVENOUS
  Filled 2018-07-12 (×2): qty 500

## 2018-07-12 MED ORDER — BUTALBITAL-APAP-CAFFEINE 50-325-40 MG PO TABS: 1.0000 | ORAL_TABLET | Freq: Four times a day (QID) | ORAL | Status: DC | PRN

## 2018-07-12 MED ORDER — VANCOMYCIN HCL 10 G IV SOLR
1750.0000 mg | INTRAVENOUS | Status: AC
  Administered 2018-07-12: 1750 mg via INTRAVENOUS
  Filled 2018-07-12: qty 1750

## 2018-07-12 MED ORDER — VANCOMYCIN HCL 10 G IV SOLR
1500.0000 mg | Freq: Two times a day (BID) | INTRAVENOUS | Status: DC
  Administered 2018-07-12 – 2018-07-13 (×2): 1500 mg via INTRAVENOUS
  Filled 2018-07-12 (×3): qty 1500

## 2018-07-12 MED ORDER — SODIUM CHLORIDE 0.9 % IV SOLN
500.0000 mg | Freq: Once | INTRAVENOUS | Status: AC
  Administered 2018-07-12: 500 mg via INTRAVENOUS
  Filled 2018-07-12: qty 500

## 2018-07-12 MED ORDER — ACETAMINOPHEN 325 MG PO TABS
650.0000 mg | ORAL_TABLET | Freq: Four times a day (QID) | ORAL | Status: DC | PRN
  Administered 2018-07-12 – 2018-07-16 (×5): 650 mg via ORAL
  Filled 2018-07-12 (×5): qty 2

## 2018-07-12 MED ORDER — SODIUM CHLORIDE 0.9 % IV SOLN
1.0000 g | INTRAVENOUS | Status: DC
  Administered 2018-07-13 – 2018-07-14 (×2): 1 g via INTRAVENOUS
  Filled 2018-07-12 (×2): qty 1

## 2018-07-12 MED ORDER — SODIUM CHLORIDE 0.9 % IV SOLN
INTRAVENOUS | Status: DC
  Administered 2018-07-12 (×3): via INTRAVENOUS

## 2018-07-12 MED ORDER — QUETIAPINE FUMARATE 50 MG PO TABS: 50.0000 mg | ORAL_TABLET | Freq: Every day | ORAL | Status: DC

## 2018-07-12 MED ORDER — SODIUM CHLORIDE 0.9 % IV SOLN
1.0000 g | Freq: Once | INTRAVENOUS | Status: AC
  Administered 2018-07-12: 1 g via INTRAVENOUS
  Filled 2018-07-12: qty 10

## 2018-07-12 MED ORDER — ONDANSETRON HCL 4 MG/2ML IJ SOLN: 4.0000 mg | Freq: Four times a day (QID) | INTRAMUSCULAR | Status: DC | PRN

## 2018-07-12 MED ORDER — IOPAMIDOL (ISOVUE-300) INJECTION 61%
INTRAVENOUS | Status: AC
  Filled 2018-07-12: qty 100

## 2018-07-12 MED ORDER — SODIUM CHLORIDE (PF) 0.9 % IJ SOLN
INTRAMUSCULAR | Status: AC
  Filled 2018-07-12: qty 50

## 2018-07-12 MED ORDER — VANCOMYCIN HCL 10 G IV SOLR: 1750.0000 mg | INTRAVENOUS | Status: DC

## 2018-07-12 MED ORDER — ONDANSETRON HCL 4 MG PO TABS: 4.0000 mg | ORAL_TABLET | Freq: Four times a day (QID) | ORAL | Status: DC | PRN

## 2018-07-12 MED ORDER — QUETIAPINE FUMARATE 50 MG PO TABS
75.0000 mg | ORAL_TABLET | Freq: Every day | ORAL | Status: DC
  Administered 2018-07-12 – 2018-07-15 (×4): 75 mg via ORAL
  Filled 2018-07-12 (×5): qty 1

## 2018-07-12 MED ORDER — ACETAMINOPHEN 650 MG RE SUPP: 650.0000 mg | Freq: Four times a day (QID) | RECTAL | Status: DC | PRN

## 2018-07-12 NOTE — Progress Notes (Signed)
Pharmacy Antibiotic Note  Nathan Morton is a 27 y.o. male admitted on 07/11/2018 s/p heroin overdose.  Pharmacy has been consulted for Vancomycin dosing for probable PNA.  Patient also on ceftriaxone and azithromycin  Assessment:  WBC 17.4 - decreasing  SCr = 1, decreased and WNL  PCT 5.4  Lactate 1.4  Plan:  Given improvement in SCr, will increase vancomycin to 1500 mg IV q12h for estimated AUC of 490 (Goal AUC 400-500)  Follow renal function  Follow culture results and sensitivities   Height: 5\' 10"  (177.8 cm) Weight: 204 lb 5.9 oz (92.7 kg) IBW/kg (Calculated) : 73  Temp (24hrs), Avg:98.9 F (37.2 C), Min:98 F (36.7 C), Nathan:99.8 F (37.7 C)  Recent Labs  Lab 07/11/18 2246 07/12/18 0052 07/12/18 0330 07/12/18 0630 07/12/18 0632  WBC 44.0*  --   --   --  17.4*  CREATININE 1.53*  --   --   --  0.99  LATICACIDVEN  --  2.04* 1.2 1.4  --     Estimated Creatinine Clearance: 130.5 mL/min (by C-G formula based on SCr of 0.99 mg/dL).    Allergies  Allergen Reactions  . Adderall [Amphetamine-Dextroamphetamine] Rash    Antimicrobials this admission: 12/9 Ceftriaxone >>   12/9 Azith >>   12/9 Vanc >>  Dose adjustments this admission:   Microbiology results: 12/9 BCx: sent 12/9 Sputum: sent  12/9 MRSA PCR: sent  Thank you for allowing pharmacy to be a part of this patient's care.  Cindi CarbonMary M Juron Vorhees, PharmD 07/12/2018 2:53 PM

## 2018-07-12 NOTE — H&P (Addendum)
History and Physical    Nathan Morton ZOX:[id number] DOB: 1991-07-14 DOA: 07/11/2018  PCP: Patient, No Pcp Per  Patient coming from: Home.  Chief Complaint: Loss of consciousness.  HPI: Nathan Morton is a 27 y.o. male with history of IV drug abuse states he overdosed with heroin and his friend brought him to the ER.  Patient states he overdosed with suicidal ideation.  Was recently in behavioral health for suicidal thoughts and was discharged home on Seroquel.  Patient states he only takes half the dose of Seroquel.  Has been having cough for last 1 week.  Denies any chest pain or shortness of breath.  Denies any fever chills.   After the loss of consciousness patient is complaining of left hip area pain with difficulty moving his left lower extremity due to pain.  ED Course: In the ER patient had CT head C-spine chest and abdomen.  Which shows right-sided upper lobe infiltrates concerning for possible contusion versus pneumonia.  Patient's lab work show significant leukocytosis of 44,000.  Blood cultures were obtained and started on empiric antibiotics for pneumonia.  After admission patient is having some hemoptysis.  Still has suicidal thoughts.  Review of Systems: As per HPI, rest all negative.   Past Medical History:  Diagnosis Date  . Hypertension     History reviewed. No pertinent surgical history.   reports that he has been smoking cigarettes. He has been smoking about 1.00 pack per day. He has never used smokeless tobacco. He reports that he drank alcohol. He reports that he has current or past drug history.  Allergies  Allergen Reactions  . Adderall [Amphetamine-Dextroamphetamine] Rash    Family History  Family history unknown: Yes    Prior to Admission medications   Medication Sig Start Date End Date Taking? Authorizing Provider  hydrOXYzine (ATARAX/VISTARIL) 50 MG tablet Take 1 tablet (50 mg total) by mouth every 6 (six) hours as needed for anxiety. 07/02/18  Yes  [place], Burlene Arnt, MD  naltrexone (DEPADE) 50 MG tablet Take 1 tablet (50 mg total) by mouth daily. 07/03/18  Yes Micheal Likens, MD  QUEtiapine (SEROQUEL) 100 MG tablet Take 1.5 tablets (150 mg total) by mouth at bedtime. 07/02/18  Yes Micheal Likens, MD    Physical Exam: Vitals:   07/12/18 0053 07/12/18 0213 07/12/18 0230 07/12/18 0300  BP: 103/73 108/87 100/72 105/77  Pulse: (!) 120 (!) 113 (!) 113 (!) 110  Resp: 13 16 12 14   Temp: 99.8 F (37.7 C) 99.1 F (37.3 C)    TempSrc: Rectal Oral    SpO2: 98% 100% 99% 99%  Weight:      Height:          Constitutional: Moderately built and nourished. Vitals:   07/12/18 0053 07/12/18 0213 07/12/18 0230 07/12/18 0300  BP: 103/73 108/87 100/72 105/77  Pulse: (!) 120 (!) 113 (!) 113 (!) 110  Resp: 13 16 12 14   Temp: 99.8 F (37.7 C) 99.1 F (37.3 C)    TempSrc: Rectal Oral    SpO2: 98% 100% 99% 99%  Weight:      Height:       Eyes: Anicteric no pallor. ENMT: No discharge from the ears eyes nose or mouth. Neck: No mass felt.  No neck rigidity. Respiratory: No rhonchi or crepitations. Cardiovascular: S1-S2 heard. Abdomen: Soft nontender bowel sounds present. Musculoskeletal: No edema.  No joint effusion. Skin: No rash. Neurologic: Alert awake oriented to time place and person.  Mild  weakness on the left lower extremity unable to raise it completely.  Partially restricted by pain. Psychiatric: Has suicidal thoughts.   Labs on Admission: I have personally reviewed following labs and imaging studies  CBC: Recent Labs  Lab 07/11/18 2246  WBC 44.0*  NEUTROABS 41.8*  HGB 17.9*  HCT 56.3*  MCV 93.7  PLT 332   Basic Metabolic Panel: Recent Labs  Lab 07/11/18 2246  NA 142  K 3.9  CL 98  CO2 22  GLUCOSE 209*  BUN 13  CREATININE 1.53*  CALCIUM 9.1   GFR: Estimated Creatinine Clearance: 82.7 mL/min (A) (by C-G formula based on SCr of 1.53 mg/dL (H)). Liver Function Tests: Recent Labs    Lab 07/11/18 2246  AST 40  ALT 32  ALKPHOS 87  BILITOT 1.0  PROT 8.3*  ALBUMIN 4.9   No results for input(s): LIPASE, AMYLASE in the last 168 hours. No results for input(s): AMMONIA in the last 168 hours. Coagulation Profile: No results for input(s): INR, PROTIME in the last 168 hours. Cardiac Enzymes: Recent Labs  Lab 07/11/18 2356  CKTOTAL 174   BNP (last 3 results) No results for input(s): PROBNP in the last 8760 hours. HbA1C: No results for input(s): HGBA1C in the last 72 hours. CBG: No results for input(s): GLUCAP in the last 168 hours. Lipid Profile: No results for input(s): CHOL, HDL, LDLCALC, TRIG, CHOLHDL, LDLDIRECT in the last 72 hours. Thyroid Function Tests: No results for input(s): TSH, T4TOTAL, FREET4, T3FREE, THYROIDAB in the last 72 hours. Anemia Panel: No results for input(s): VITAMINB12, FOLATE, FERRITIN, TIBC, IRON, RETICCTPCT in the last 72 hours. Urine analysis:    Component Value Date/Time   COLORURINE YELLOW 07/12/2018 0027   APPEARANCEUR HAZY (A) 07/12/2018 0027   LABSPEC 1.028 07/12/2018 0027   PHURINE 5.0 07/12/2018 0027   GLUCOSEU NEGATIVE 07/12/2018 0027   HGBUR LARGE (A) 07/12/2018 0027   BILIRUBINUR NEGATIVE 07/12/2018 0027   KETONESUR 20 (A) 07/12/2018 0027   PROTEINUR 100 (A) 07/12/2018 0027   NITRITE NEGATIVE 07/12/2018 0027   LEUKOCYTESUR TRACE (A) 07/12/2018 0027   Sepsis Labs: @LABRCNTIP (procalcitonin:4,lacticidven:4) )No results found for this or any previous visit (from the past 240 hour(s)).   Radiological Exams on Admission: Ct Head Wo Contrast  Result Date: 07/12/2018 CLINICAL DATA:  Head trauma and neck pain following a fall. EXAM: CT HEAD WITHOUT CONTRAST CT CERVICAL SPINE WITHOUT CONTRAST TECHNIQUE: Multidetector CT imaging of the head and cervical spine was performed following the standard protocol without intravenous contrast. Multiplanar CT image reconstructions of the cervical spine were also generated. COMPARISON:   10/30/2010. FINDINGS: CT HEAD FINDINGS Brain: Normal appearing cerebral hemispheres and posterior fossa structures. Normal size and position of the ventricles. No intracranial hemorrhage, mass lesion or CT evidence of acute infarction. Vascular: No hyperdense vessel or unexpected calcification. Skull: Normal. Negative for fracture or focal lesion. Sinuses/Orbits: Mild to moderate posterior maxillary sinus mucosal thickening on the right. Unremarkable orbits. Other: None. CT CERVICAL SPINE FINDINGS Alignment: Minimal reversal the normal cervical lordosis with mild progression and minimal levoconvex cervical scoliosis, not previously present. No subluxations. Skull base and vertebrae: No acute fracture. No primary bone lesion or focal pathologic process. Soft tissues and spinal canal: No prevertebral fluid or swelling. No visible canal hematoma. Disc levels:  Unremarkable. Upper chest: Interval patchy interstitial prominence at the right lung apex and minimally at the left lung apex posteriorly. Other: Choose 1 IMPRESSION: 1. No skull fracture or intracranial hemorrhage. 2. No cervical spine fracture  or subluxation. 3. Mild to moderate chronic right maxillary sinusitis. 4. Minimal reversal of the normal cervical lordosis and minimal levoconvex cervical scoliosis, not previously present. 5. Interval patchy interstitial prominence at the right lung apex and minimally at the left lung apex posteriorly. This could be due to pulmonary contusions. This will be described in more detail on the chest CT report. Electronically Signed   By: Beckie Salts M.D.   On: 07/12/2018 01:25   Ct Chest W Contrast  Result Date: 07/12/2018 CLINICAL DATA:  Blunt chest and abdominal trauma with abdominal pain. EXAM: CT CHEST, ABDOMEN, AND PELVIS WITH CONTRAST TECHNIQUE: Multidetector CT imaging of the chest, abdomen and pelvis was performed following the standard protocol during bolus administration of intravenous contrast. CONTRAST:   ISOVUE-300 IOPAMIDOL (ISOVUE-300) INJECTION 61% COMPARISON:  Portable chest obtained earlier today. Cervical spine CT obtained today. FINDINGS: CT CHEST FINDINGS Cardiovascular: No significant vascular findings. Normal heart size. No pericardial effusion. Mediastinum/Nodes: No enlarged mediastinal, hilar, or axillary lymph nodes. Thyroid gland, trachea, and esophagus demonstrate no significant findings. No mediastinal hemorrhage. Lungs/Pleura: Patchy interstitial prominence occupying a significant portion of the right upper lobe and small amount of the superior segment of the right lower lobe. Minimal bilateral dependent atelectasis. No pleural fluid or pneumothorax. Musculoskeletal: Normal appearing bones. No fractures. CT ABDOMEN PELVIS FINDINGS Hepatobiliary: No focal liver abnormality is seen. No gallstones, gallbladder wall thickening, or biliary dilatation. Pancreas: Unremarkable. No pancreatic ductal dilatation or surrounding inflammatory changes. Spleen: Normal in size without focal abnormality. Adrenals/Urinary Tract: Small lower pole right renal cyst. Normal appearing left kidney, ureters, urinary bladder and adrenal glands. Stomach/Bowel: Stomach is within normal limits. Appendix appears normal. No evidence of bowel wall thickening, distention, or inflammatory changes. Vascular/Lymphatic: No significant vascular findings are present. No enlarged abdominal or pelvic lymph nodes. Reproductive: Prostate is unremarkable. Other: No abdominal wall hernia or abnormality. No abdominopelvic ascites. Musculoskeletal: Unremarkable bones. No fractures. IMPRESSION: 1. Patchy interstitial prominence occupying a significant portion of the right upper lobe and small amount of the superior segment of the right lower lobe. Given the history of blunt trauma, this is most likely due to pulmonary contusion. Interstitial pneumonitis can also have this appearance. 2. No acute abnormality in the abdomen or pelvis.  Electronically Signed   By: Beckie Salts M.D.   On: 07/12/2018 01:33   Ct Cervical Spine Wo Contrast  Result Date: 07/12/2018 CLINICAL DATA:  Head trauma and neck pain following a fall. EXAM: CT HEAD WITHOUT CONTRAST CT CERVICAL SPINE WITHOUT CONTRAST TECHNIQUE: Multidetector CT imaging of the head and cervical spine was performed following the standard protocol without intravenous contrast. Multiplanar CT image reconstructions of the cervical spine were also generated. COMPARISON:  10/30/2010. FINDINGS: CT HEAD FINDINGS Brain: Normal appearing cerebral hemispheres and posterior fossa structures. Normal size and position of the ventricles. No intracranial hemorrhage, mass lesion or CT evidence of acute infarction. Vascular: No hyperdense vessel or unexpected calcification. Skull: Normal. Negative for fracture or focal lesion. Sinuses/Orbits: Mild to moderate posterior maxillary sinus mucosal thickening on the right. Unremarkable orbits. Other: None. CT CERVICAL SPINE FINDINGS Alignment: Minimal reversal the normal cervical lordosis with mild progression and minimal levoconvex cervical scoliosis, not previously present. No subluxations. Skull base and vertebrae: No acute fracture. No primary bone lesion or focal pathologic process. Soft tissues and spinal canal: No prevertebral fluid or swelling. No visible canal hematoma. Disc levels:  Unremarkable. Upper chest: Interval patchy interstitial prominence at the right lung apex and minimally at  the left lung apex posteriorly. Other: Choose 1 IMPRESSION: 1. No skull fracture or intracranial hemorrhage. 2. No cervical spine fracture or subluxation. 3. Mild to moderate chronic right maxillary sinusitis. 4. Minimal reversal of the normal cervical lordosis and minimal levoconvex cervical scoliosis, not previously present. 5. Interval patchy interstitial prominence at the right lung apex and minimally at the left lung apex posteriorly. This could be due to pulmonary  contusions. This will be described in more detail on the chest CT report. Electronically Signed   By: Beckie Salts M.D.   On: 07/12/2018 01:25   Ct Abdomen Pelvis W Contrast  Result Date: 07/12/2018 CLINICAL DATA:  Blunt chest and abdominal trauma with abdominal pain. EXAM: CT CHEST, ABDOMEN, AND PELVIS WITH CONTRAST TECHNIQUE: Multidetector CT imaging of the chest, abdomen and pelvis was performed following the standard protocol during bolus administration of intravenous contrast. CONTRAST:  ISOVUE-300 IOPAMIDOL (ISOVUE-300) INJECTION 61% COMPARISON:  Portable chest obtained earlier today. Cervical spine CT obtained today. FINDINGS: CT CHEST FINDINGS Cardiovascular: No significant vascular findings. Normal heart size. No pericardial effusion. Mediastinum/Nodes: No enlarged mediastinal, hilar, or axillary lymph nodes. Thyroid gland, trachea, and esophagus demonstrate no significant findings. No mediastinal hemorrhage. Lungs/Pleura: Patchy interstitial prominence occupying a significant portion of the right upper lobe and small amount of the superior segment of the right lower lobe. Minimal bilateral dependent atelectasis. No pleural fluid or pneumothorax. Musculoskeletal: Normal appearing bones. No fractures. CT ABDOMEN PELVIS FINDINGS Hepatobiliary: No focal liver abnormality is seen. No gallstones, gallbladder wall thickening, or biliary dilatation. Pancreas: Unremarkable. No pancreatic ductal dilatation or surrounding inflammatory changes. Spleen: Normal in size without focal abnormality. Adrenals/Urinary Tract: Small lower pole right renal cyst. Normal appearing left kidney, ureters, urinary bladder and adrenal glands. Stomach/Bowel: Stomach is within normal limits. Appendix appears normal. No evidence of bowel wall thickening, distention, or inflammatory changes. Vascular/Lymphatic: No significant vascular findings are present. No enlarged abdominal or pelvic lymph nodes. Reproductive: Prostate is  unremarkable. Other: No abdominal wall hernia or abnormality. No abdominopelvic ascites. Musculoskeletal: Unremarkable bones. No fractures. IMPRESSION: 1. Patchy interstitial prominence occupying a significant portion of the right upper lobe and small amount of the superior segment of the right lower lobe. Given the history of blunt trauma, this is most likely due to pulmonary contusion. Interstitial pneumonitis can also have this appearance. 2. No acute abnormality in the abdomen or pelvis. Electronically Signed   By: Beckie Salts M.D.   On: 07/12/2018 01:33   Dg Chest Port 1 View  Result Date: 07/12/2018 CLINICAL DATA:  Initial evaluation for acute overdose. EXAM: PORTABLE CHEST 1 VIEW COMPARISON:  Prior radiograph from 05/16/2018. FINDINGS: The cardiac and mediastinal silhouettes are stable in size and contour, and remain within normal limits. The lungs are normally inflated. No airspace consolidation, pleural effusion, or pulmonary edema is identified. No findings to suggest aspiration. There is no pneumothorax. No acute osseous abnormality identified. IMPRESSION: No active cardiopulmonary disease. Electronically Signed   By: Rise Mu M.D.   On: 07/12/2018 00:49    EKG: Independently reviewed.  Sinus tachycardia.  Assessment/Plan Principal Problem:   Sepsis (HCC) Active Problems:   Bipolar 1 disorder, depressed, severe (HCC)   Polysubstance dependence (HCC)   CAP (community acquired pneumonia)   ARF (acute renal failure) (HCC)    1. Possible developing sepsis with possible pneumonia and also possibility of lung contusion -discussed with on-call pulmonologist who advised at this time since patient's procalcitonin levels elevated patient likely has pneumonia.  For  which patient is on antibiotics.  Follow blood cultures follow CBC follow urine Legionella strep antigen.  Sputum cultures. 2. Depression with suicidal thoughts we will keep patient on suicide precaution consult  psychiatrist in a.m.  Patient on Seroquel 100 mg daily but only takes half a dose. 3. Left lower extremity weakness for which we will check MRI brain. 4. For possible lung contusion pulmonologist advised supportive management.  See #1. 5. Hyperglycemia check hemoglobin A1c. 6. IV drug abuse -social work consult. 7. Patient's urine does show hematuria.  Repeat it after hydrating.   DVT prophylaxis: SCDs and there is concern for contusion and the patient was having some hemoptysis. Code Status: Full code. Family Communication: Discussed with patient. Disposition Plan: To be determined. Consults called: Discussed with pulmonologist. Admission status: Inpatient.   Eduard Clos MD [facility] Pager [phone].  If 7PM-7AM, please contact night-coverage [email or web address] Password [email or web address]  07/12/2018, 3:28 AM

## 2018-07-12 NOTE — ED Notes (Signed)
Patient transported to MRI 

## 2018-07-12 NOTE — ED Provider Notes (Signed)
I assumed care in sign out to follow-up on imaging.  Patient is awake and alert, but tachycardic.  He reports back pain, but is in no acute distress.  EKG Interpretation  Date/Time:  Sunday July 11 2018 22:24:13 EST Ventricular Rate:  128 PR Interval:    QRS Duration: 90 QT Interval:  315 QTC Calculation: 460 R Axis:   101 Text Interpretation:  Sinus tachycardia Borderline right axis deviation Baseline wander in lead(s) V2 Confirmed by Zadie RhineWickline, Ranika Mcniel ([id number]) on 07/12/2018 12:57:51 AM      Labs and imaging are pending at this time.   Zadie RhineWickline, Janique Hoefer, MD 07/12/18 [phone]

## 2018-07-12 NOTE — Progress Notes (Signed)
Pharmacy Antibiotic Note  Nathan Morton is a 27 y.o. male admitted on 07/11/2018 s/p heroin overdose.  Pharmacy has been consulted for Vancomycin dosing for probably pneuomnia.  Patient also on ceftriaxone and azithromycin  Plan:  Vancomycin 1750mg  IV q24h (Goal AUC 400-500)  Follow renal function (approx 2 weeks ago SCr = 0.66)  Follow culture results and sensitivities   Height: 5\' 10"  (177.8 cm) Weight: 195 lb (88.5 kg) IBW/kg (Calculated) : 73  Temp (24hrs), Avg:99.1 F (37.3 C), Min:98.5 F (36.9 C), Nathan:99.8 F (37.7 C)  Recent Labs  Lab 07/11/18 2246 07/12/18 0052  WBC 44.0*  --   CREATININE 1.53*  --   LATICACIDVEN  --  2.04*    Estimated Creatinine Clearance: 82.7 mL/min (A) (by C-G formula based on SCr of 1.53 mg/dL (H)).    Allergies  Allergen Reactions  . Adderall [Amphetamine-Dextroamphetamine] Rash    Antimicrobials this admission: 12/9 Ceftriaxone >>   12/9 Azith >>   12/9 Vanc >>  Dose adjustments this admission:    Microbiology results: 12/9 BCx: sent 12/9 Sputum: sent  12/9 MRSA PCR: sent  Thank you for allowing pharmacy to be a part of this patient's care.  Maryellen PilePoindexter, Samira Acero Trefz, PharmD 07/12/2018 3:40 AM

## 2018-07-12 NOTE — Progress Notes (Signed)
Pt is requesting to be discharged to a rehab facility. He states that he, "wants to get better and stop using". He prefers to be d/ced to [facility] [place] if possible. Will communicate with Social worker to see what his possibilities are.

## 2018-07-12 NOTE — Progress Notes (Signed)
The patient was admitted early this morning after midnight and H&P has been reviewed and I am in current agreement with assessment and plan done by Dr. Midge MiniumArshad Kakrakandy.  Additional changes the plan of care been made accordingly.  Patient is a 27 year old Caucasian male with past medical history significant for hypertension, history of IV drug abuse with multiple relapses who states he was clean for about 2 months until about 2 days ago when he did IV drug use.  And yesterday he intentionally overdosed with heroin and a suicide attempt.  He is recently behavioral health for suicidal thoughts and was discharged home with Seroquel has been only taking half the dose of Seroquel.  Also states that he is having a cough for about a week.  Patient states he would need on his heroin and had a loss of consciousness and when he came to call his friend for help and his friend came and found on the bottom to the emergency room.  He is complaining of left hip pain with difficulty moving his left lower extremity due to the pain.  He is brought to the emergency room and had a head CT as well as CT of spine chest and abdomen.  His CT scan showed right upper sided infiltrate concerning for possible contusion versus pneumonia.  He had a leukocytosis 44,000 and blood cultures were ordered and he started on empiric antibiotics for pneumonia.  After admission he is stating that he had some hemoptysis and still was having suicidal thoughts but states that he wants help.  Is admitted for possible sepsis due to suspected pneumonia with versus a lung contusion.  My colleague and partner Dr. Toniann FailKakrakandy discussed with pulmonology who advises at the time of patient's evaluation procalcitonin level was elevated is likely a pneumonia and recommend continue antibiotics.  Psychiatry was consulted because of the patient's suicidal thoughts.  Because he had left lower extremity weakness and MRI of the brain was checked which was negative.   Pulmonology recommended supportive management for his possible lung contusion.  Urine did show some hematuria but there is also concern for some rhabdomyolysis as it is unknown how long the patient was down on the floor for.  Patient's troponin was elevated but not likely indicative of ACS he did not complain any chest pain and we will cycle cardiac troponins.  We will continue monitor patient's clinical response to intervention and continue with IV fluid hydration.  We will repeat blood work in the a.m. and repeat chest x-ray in a.m. as well.
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
## 2018-07-12 NOTE — ED Provider Notes (Signed)
CT scan reveals probable pneumonia, could be due to aspiration from overdose Overall his vitals are improving.  He is awake and alert IV antibiotics are ordered and I have consulted internal medicine Dr. Toniann FailKakrakandy After I left the room, he confessed to nursing that he is suicidal Labs including salicylate and Tylenol levels have been ordered.  Patient is noted to have an anion gap. A sitter has been ordered for patient   Nathan Morton, Nathan Mercier, MD 07/12/18 [phone]

## 2018-07-12 NOTE — ED Notes (Signed)
X-ray at bedside

## 2018-07-12 NOTE — ED Notes (Signed)
Patient transported to CT 

## 2018-07-12 NOTE — ED Notes (Signed)
ED TO INPATIENT HANDOFF REPORT  Name/Age/Gender Nathan Morton 27 y.o. male  Code Status Code Status History    Date Active Date Inactive Code Status Order ID Comments User Context   06/29/2018 0144 07/02/2018 1555 Full Code [id number]  Kerry Hough PA-C Inpatient      Home/SNF/Other Home  Chief Complaint Overdose   Level of Care/Admitting Diagnosis ED Disposition    ED Disposition Condition Comment   Admit  Hospital Area: [facility] [[id number]]  Level of Care: Telemetry [5]  Admit to tele based on following criteria: Monitor for Ischemic changes  Diagnosis: CAP (community acquired pneumonia) [409811]  Admitting Physician: Eduard Clos [phone]  Attending Physician: Eduard Clos [phone]  Estimated length of stay: past midnight tomorrow  Certification:: I certify this patient will need inpatient services for at least 2 midnights  PT Class (Do Not Modify): Inpatient [101]  PT Acc Code (Do Not Modify): Private [1]       Medical History Past Medical History:  Diagnosis Date  . Hypertension     Allergies Allergies  Allergen Reactions  . Adderall [Amphetamine-Dextroamphetamine] Rash    IV Location/Drains/Wounds Patient Lines/Drains/Airways Status   Active Line/Drains/Airways    Name:   Placement date:   Placement time:   Site:   Days:   Peripheral IV 07/11/18 Left Antecubital   07/11/18    2220    Antecubital   1          Labs/Imaging Results for orders placed or performed during the hospital encounter of 07/11/18 (from the past 48 hour(s))  Comprehensive metabolic panel     Status: Abnormal   Collection Time: 07/11/18 10:46 PM  Result Value Ref Range   Sodium 142 135 - 145 mmol/L    Comment: REPEATED TO VERIFY   Potassium 3.9 3.5 - 5.1 mmol/L    Comment: REPEATED TO VERIFY   Chloride 98 98 - 111 mmol/L    Comment: REPEATED TO VERIFY   CO2 22 22 - 32 mmol/L    Comment: REPEATED TO VERIFY   Glucose, Bld 209 (H) 70 - 99  mg/dL   BUN 13 6 - 20 mg/dL   Creatinine, Ser 6.21 (H) 0.61 - 1.24 mg/dL   Calcium 9.1 8.9 - 30.8 mg/dL    Comment: REPEATED TO VERIFY   Total Protein 8.3 (H) 6.5 - 8.1 g/dL   Albumin 4.9 3.5 - 5.0 g/dL   AST 40 15 - 41 U/L   ALT 32 0 - 44 U/L   Alkaline Phosphatase 87 38 - 126 U/L   Total Bilirubin 1.0 0.3 - 1.2 mg/dL   GFR calc non Af Amer >60 >60 mL/min   GFR calc Af Amer >60 >60 mL/min   Anion gap 22 (H) 5 - 15    Comment: REPEATED TO VERIFY Performed at [facility], [address], [place], [place] [address]   CBC with Differential     Status: Abnormal   Collection Time: 07/11/18 10:46 PM  Result Value Ref Range   WBC 44.0 (H) 4.0 - 10.5 K/uL    Comment: REPEATED TO VERIFY WHITE COUNT CONFIRMED ON SMEAR    RBC 6.01 (H) 4.22 - 5.81 MIL/uL   Hemoglobin 17.9 (H) 13.0 - 17.0 g/dL   HCT 69.6 (H) 29.5 - 28.4 %    Comment: REPEATED TO VERIFY   MCV 93.7 80.0 - 100.0 fL   MCH 29.8 26.0 - 34.0 pg   MCHC 31.8 30.0 -  36.0 g/dL   RDW 16.113.0 09.611.5 - 04.515.5 %   Platelets 332 150 - 400 K/uL   nRBC 0.0 0.0 - 0.2 %   Neutrophils Relative % 86 %   Lymphocytes Relative 4 %   Monocytes Relative 1 %   Eosinophils Relative 0 %   Basophils Relative 0 %   Band Neutrophils 8 %   Metamyelocytes Relative 1 %   Myelocytes 0 %   Promyelocytes Relative 0 %   Blasts 0 %   nRBC 0 0 /100 WBC   Other 0 %   Neutro Abs 41.8 (H) 1.7 - 7.7 K/uL   Lymphs Abs 1.8 0.7 - 4.0 K/uL   Monocytes Absolute 0.4 0.1 - 1.0 K/uL   Eosinophils Absolute 0.0 0.0 - 0.5 K/uL   Basophils Absolute 0.0 0.0 - 0.1 K/uL   RBC Morphology POLYCHROMASIA PRESENT    WBC Morphology MILD LEFT SHIFT (1-5% METAS, OCC MYELO, OCC BANDS)     Comment: Performed at [facility], [address] [place], [place] [address]  Ethanol     Status: None   Collection Time: 07/11/18 10:48 PM  Result Value Ref Range   Alcohol, Ethyl (B) <10 <10 mg/dL    Comment: (NOTE) Lowest detectable limit for serum  alcohol is 10 mg/dL. For medical purposes only. Performed at [facility], [address] [place], [place] [address]   Salicylate level     Status: None   Collection Time: 07/11/18 10:48 PM  Result Value Ref Range   Salicylate Lvl <7.0 2.8 - 30.0 mg/dL    Comment: Performed at [facility], [address] [place], [place] [address]  Acetaminophen level     Status: Abnormal   Collection Time: 07/11/18 10:48 PM  Result Value Ref Range   Acetaminophen (Tylenol), Serum <10 (L) 10 - 30 ug/mL    Comment: (NOTE) Therapeutic concentrations vary significantly. A range of 10-30 ug/mL  may be an effective concentration for many patients. However, some  are best treated at concentrations outside of this range. Acetaminophen concentrations >150 ug/mL at 4 hours after ingestion  and >50 ug/mL at 12 hours after ingestion are often associated with  toxic reactions. Performed at [facility], [address] [place], [place] [address]   CK     Status: None   Collection Time: 07/11/18 11:56 PM  Result Value Ref Range   Total CK 174 49 - 397 U/L    Comment: Performed at [facility], [address] [place], [place] [address]  Urinalysis, Routine w reflex microscopic     Status: Abnormal   Collection Time: 07/12/18 12:27 AM  Result Value Ref Range   Color, Urine YELLOW YELLOW   APPearance HAZY (A) CLEAR   Specific Gravity, Urine 1.028 1.005 - 1.030   pH 5.0 5.0 - 8.0   Glucose, UA NEGATIVE NEGATIVE mg/dL   Hgb urine dipstick LARGE (A) NEGATIVE   Bilirubin Urine NEGATIVE NEGATIVE   Ketones, ur 20 (A) NEGATIVE mg/dL   Protein, ur 696100 (A) NEGATIVE mg/dL   Nitrite NEGATIVE NEGATIVE   Leukocytes, UA TRACE (A) NEGATIVE   RBC / HPF >50 (H) 0 - 5 RBC/hpf   WBC, UA 11-20 0 - 5 WBC/hpf   Bacteria, UA RARE (A) NONE SEEN   Squamous Epithelial / LPF 0-5 0 - 5   Mucus PRESENT    Hyaline Casts, UA PRESENT    Cellular Cast,  UA 1  Comment: Performed at [facility], [address], [place], [place] [address]  I-Stat CG4 Lactic Acid, ED     Status: Abnormal   Collection Time: 07/12/18 12:52 AM  Result Value Ref Range   Lactic Acid, Venous 2.04 (HH) 0.5 - 1.9 mmol/L   Comment NOTIFIED PHYSICIAN    Ct Head Wo Contrast  Result Date: 07/12/2018 CLINICAL DATA:  Head trauma and neck pain following a fall. EXAM: CT HEAD WITHOUT CONTRAST CT CERVICAL SPINE WITHOUT CONTRAST TECHNIQUE: Multidetector CT imaging of the head and cervical spine was performed following the standard protocol without intravenous contrast. Multiplanar CT image reconstructions of the cervical spine were also generated. COMPARISON:  10/30/2010. FINDINGS: CT HEAD FINDINGS Brain: Normal appearing cerebral hemispheres and posterior fossa structures. Normal size and position of the ventricles. No intracranial hemorrhage, mass lesion or CT evidence of acute infarction. Vascular: No hyperdense vessel or unexpected calcification. Skull: Normal. Negative for fracture or focal lesion. Sinuses/Orbits: Mild to moderate posterior maxillary sinus mucosal thickening on the right. Unremarkable orbits. Other: None. CT CERVICAL SPINE FINDINGS Alignment: Minimal reversal the normal cervical lordosis with mild progression and minimal levoconvex cervical scoliosis, not previously present. No subluxations. Skull base and vertebrae: No acute fracture. No primary bone lesion or focal pathologic process. Soft tissues and spinal canal: No prevertebral fluid or swelling. No visible canal hematoma. Disc levels:  Unremarkable. Upper chest: Interval patchy interstitial prominence at the right lung apex and minimally at the left lung apex posteriorly. Other: Choose 1 IMPRESSION: 1. No skull fracture or intracranial hemorrhage. 2. No cervical spine fracture or subluxation. 3. Mild to moderate chronic right maxillary sinusitis. 4. Minimal reversal of the normal cervical  lordosis and minimal levoconvex cervical scoliosis, not previously present. 5. Interval patchy interstitial prominence at the right lung apex and minimally at the left lung apex posteriorly. This could be due to pulmonary contusions. This will be described in more detail on the chest CT report. Electronically Signed   By: Beckie Salts M.D.   On: 07/12/2018 01:25   Ct Chest W Contrast  Result Date: 07/12/2018 CLINICAL DATA:  Blunt chest and abdominal trauma with abdominal pain. EXAM: CT CHEST, ABDOMEN, AND PELVIS WITH CONTRAST TECHNIQUE: Multidetector CT imaging of the chest, abdomen and pelvis was performed following the standard protocol during bolus administration of intravenous contrast. CONTRAST:  ISOVUE-300 IOPAMIDOL (ISOVUE-300) INJECTION 61% COMPARISON:  Portable chest obtained earlier today. Cervical spine CT obtained today. FINDINGS: CT CHEST FINDINGS Cardiovascular: No significant vascular findings. Normal heart size. No pericardial effusion. Mediastinum/Nodes: No enlarged mediastinal, hilar, or axillary lymph nodes. Thyroid gland, trachea, and esophagus demonstrate no significant findings. No mediastinal hemorrhage. Lungs/Pleura: Patchy interstitial prominence occupying a significant portion of the right upper lobe and small amount of the superior segment of the right lower lobe. Minimal bilateral dependent atelectasis. No pleural fluid or pneumothorax. Musculoskeletal: Normal appearing bones. No fractures. CT ABDOMEN PELVIS FINDINGS Hepatobiliary: No focal liver abnormality is seen. No gallstones, gallbladder wall thickening, or biliary dilatation. Pancreas: Unremarkable. No pancreatic ductal dilatation or surrounding inflammatory changes. Spleen: Normal in size without focal abnormality. Adrenals/Urinary Tract: Small lower pole right renal cyst. Normal appearing left kidney, ureters, urinary bladder and adrenal glands. Stomach/Bowel: Stomach is within normal limits. Appendix appears normal. No  evidence of bowel wall thickening, distention, or inflammatory changes. Vascular/Lymphatic: No significant vascular findings are present. No enlarged abdominal or pelvic lymph nodes. Reproductive: Prostate is unremarkable. Other: No abdominal wall hernia or abnormality. No abdominopelvic ascites.  Musculoskeletal: Unremarkable bones. No fractures. IMPRESSION: 1. Patchy interstitial prominence occupying a significant portion of the right upper lobe and small amount of the superior segment of the right lower lobe. Given the history of blunt trauma, this is most likely due to pulmonary contusion. Interstitial pneumonitis can also have this appearance. 2. No acute abnormality in the abdomen or pelvis. Electronically Signed   By: Beckie Salts M.D.   On: 07/12/2018 01:33   Ct Cervical Spine Wo Contrast  Result Date: 07/12/2018 CLINICAL DATA:  Head trauma and neck pain following a fall. EXAM: CT HEAD WITHOUT CONTRAST CT CERVICAL SPINE WITHOUT CONTRAST TECHNIQUE: Multidetector CT imaging of the head and cervical spine was performed following the standard protocol without intravenous contrast. Multiplanar CT image reconstructions of the cervical spine were also generated. COMPARISON:  10/30/2010. FINDINGS: CT HEAD FINDINGS Brain: Normal appearing cerebral hemispheres and posterior fossa structures. Normal size and position of the ventricles. No intracranial hemorrhage, mass lesion or CT evidence of acute infarction. Vascular: No hyperdense vessel or unexpected calcification. Skull: Normal. Negative for fracture or focal lesion. Sinuses/Orbits: Mild to moderate posterior maxillary sinus mucosal thickening on the right. Unremarkable orbits. Other: None. CT CERVICAL SPINE FINDINGS Alignment: Minimal reversal the normal cervical lordosis with mild progression and minimal levoconvex cervical scoliosis, not previously present. No subluxations. Skull base and vertebrae: No acute fracture. No primary bone lesion or focal  pathologic process. Soft tissues and spinal canal: No prevertebral fluid or swelling. No visible canal hematoma. Disc levels:  Unremarkable. Upper chest: Interval patchy interstitial prominence at the right lung apex and minimally at the left lung apex posteriorly. Other: Choose 1 IMPRESSION: 1. No skull fracture or intracranial hemorrhage. 2. No cervical spine fracture or subluxation. 3. Mild to moderate chronic right maxillary sinusitis. 4. Minimal reversal of the normal cervical lordosis and minimal levoconvex cervical scoliosis, not previously present. 5. Interval patchy interstitial prominence at the right lung apex and minimally at the left lung apex posteriorly. This could be due to pulmonary contusions. This will be described in more detail on the chest CT report. Electronically Signed   By: Beckie Salts M.D.   On: 07/12/2018 01:25   Ct Abdomen Pelvis W Contrast  Result Date: 07/12/2018 CLINICAL DATA:  Blunt chest and abdominal trauma with abdominal pain. EXAM: CT CHEST, ABDOMEN, AND PELVIS WITH CONTRAST TECHNIQUE: Multidetector CT imaging of the chest, abdomen and pelvis was performed following the standard protocol during bolus administration of intravenous contrast. CONTRAST:  ISOVUE-300 IOPAMIDOL (ISOVUE-300) INJECTION 61% COMPARISON:  Portable chest obtained earlier today. Cervical spine CT obtained today. FINDINGS: CT CHEST FINDINGS Cardiovascular: No significant vascular findings. Normal heart size. No pericardial effusion. Mediastinum/Nodes: No enlarged mediastinal, hilar, or axillary lymph nodes. Thyroid gland, trachea, and esophagus demonstrate no significant findings. No mediastinal hemorrhage. Lungs/Pleura: Patchy interstitial prominence occupying a significant portion of the right upper lobe and small amount of the superior segment of the right lower lobe. Minimal bilateral dependent atelectasis. No pleural fluid or pneumothorax. Musculoskeletal: Normal appearing bones. No fractures.  CT ABDOMEN PELVIS FINDINGS Hepatobiliary: No focal liver abnormality is seen. No gallstones, gallbladder wall thickening, or biliary dilatation. Pancreas: Unremarkable. No pancreatic ductal dilatation or surrounding inflammatory changes. Spleen: Normal in size without focal abnormality. Adrenals/Urinary Tract: Small lower pole right renal cyst. Normal appearing left kidney, ureters, urinary bladder and adrenal glands. Stomach/Bowel: Stomach is within normal limits. Appendix appears normal. No evidence of bowel wall thickening, distention, or inflammatory changes. Vascular/Lymphatic: No significant vascular findings are  present. No enlarged abdominal or pelvic lymph nodes. Reproductive: Prostate is unremarkable. Other: No abdominal wall hernia or abnormality. No abdominopelvic ascites. Musculoskeletal: Unremarkable bones. No fractures. IMPRESSION: 1. Patchy interstitial prominence occupying a significant portion of the right upper lobe and small amount of the superior segment of the right lower lobe. Given the history of blunt trauma, this is most likely due to pulmonary contusion. Interstitial pneumonitis can also have this appearance. 2. No acute abnormality in the abdomen or pelvis. Electronically Signed   By: Beckie Salts M.D.   On: 07/12/2018 01:33   Dg Chest Port 1 View  Result Date: 07/12/2018 CLINICAL DATA:  Initial evaluation for acute overdose. EXAM: PORTABLE CHEST 1 VIEW COMPARISON:  Prior radiograph from 05/16/2018. FINDINGS: The cardiac and mediastinal silhouettes are stable in size and contour, and remain within normal limits. The lungs are normally inflated. No airspace consolidation, pleural effusion, or pulmonary edema is identified. No findings to suggest aspiration. There is no pneumothorax. No acute osseous abnormality identified. IMPRESSION: No active cardiopulmonary disease. Electronically Signed   By: Rise Mu M.D.   On: 07/12/2018 00:49   EKG  Interpretation  Date/Time:  Sunday July 11 2018 22:24:13 EST Ventricular Rate:  128 PR Interval:    QRS Duration: 90 QT Interval:  315 QTC Calculation: 460 R Axis:   101 Text Interpretation:  Sinus tachycardia Borderline right axis deviation Baseline wander in lead(s) V2 Confirmed by Wickline, Donald ([id number]) on 07/12/2018 12:57:51 AM   Pending Labs Unresulted Labs (From admission, onward)    Start     Ordered   07/11/18 2357  Culture, blood (routine x 2)  BLOOD CULTURE X 2,   STAT     07/11/18 2356   07/11/18 2214  Urine rapid drug screen (hosp performed)  ONCE - STAT,   STAT     12 /08/19 2214          Vitals/Pain Today's Vitals   07/12/18 0053 07/12/18 0213 07/12/18 0230 07/12/18 0300  BP: 103/73 108/87 100/72 105/77  Pulse: (!) 120 (!) 113 (!) 113 (!) 110  Resp: 13 16 12 14   Temp: 99.8 F (37.7 C) 99.1 F (37.3 C)    TempSrc: Rectal Oral    SpO2: 98% 100% 99% 99%  Weight:      Height:      PainSc: 8  2       Isolation Precautions No active isolations  Medications Medications  iopamidol (ISOVUE-300) 61 % injection (has no administration in time range)  sodium chloride (PF) 0.9 % injection (has no administration in time range)  azithromycin (ZITHROMAX) 500 mg in sodium chloride 0.9 % 250 mL IVPB (500 mg Intravenous New Bag/Given 07/12/18 0308)  sodium chloride 0.9 % bolus 1,000 mL (0 mLs Intravenous Stopped 07/12/18 0006)  naloxone (NARCAN) injection 1 mg (1 mg Intravenous Given 07/11/18 2229)  sodium chloride 0.9 % bolus 1,000 mL (0 mLs Intravenous Stopped 07/12/18 0158)  iopamidol (ISOVUE-300) 61 % injection 100 mL (100 mLs Intravenous Contrast Given 07/12/18 0101)  sodium chloride 0.9 % bolus 1,000 mL (1,000 mLs Intravenous New Bag/Given 07/12/18 0207)  cefTRIAXone (ROCEPHIN) 1 g in sodium chloride 0.9 % 100 mL IVPB (0 g Intravenous Stopped 07/12/18 0307)    Mobility walks

## 2018-07-12 NOTE — ED Notes (Addendum)
Pulses present Doppler

## 2018-07-12 NOTE — ED Notes (Signed)
Called lab did add on

## 2018-07-13 ENCOUNTER — Inpatient Hospital Stay (HOSPITAL_COMMUNITY): Payer: Self-pay

## 2018-07-13 DIAGNOSIS — S27321A Contusion of lung, unilateral, initial encounter: Secondary | ICD-10-CM

## 2018-07-13 DIAGNOSIS — T1491XA Suicide attempt, initial encounter: Secondary | ICD-10-CM

## 2018-07-13 LAB — LEGIONELLA PNEUMOPHILA SEROGP 1 UR AG: L. pneumophila Serogp 1 Ur Ag: NEGATIVE

## 2018-07-13 LAB — CBC WITH DIFFERENTIAL/PLATELET
Abs Immature Granulocytes: 0.08 10*3/uL — ABNORMAL HIGH (ref 0.00–0.07)
Basophils Absolute: 0.1 10*3/uL (ref 0.0–0.1)
Basophils Relative: 0 %
Eosinophils Absolute: 0.1 10*3/uL (ref 0.0–0.5)
Eosinophils Relative: 1 %
HCT: 41.7 % (ref 39.0–52.0)
HEMOGLOBIN: 13.5 g/dL (ref 13.0–17.0)
Immature Granulocytes: 1 %
Lymphocytes Relative: 16 %
Lymphs Abs: 2.2 10*3/uL (ref 0.7–4.0)
MCH: 29.6 pg (ref 26.0–34.0)
MCHC: 32.4 g/dL (ref 30.0–36.0)
MCV: 91.4 fL (ref 80.0–100.0)
MONO ABS: 0.8 10*3/uL (ref 0.1–1.0)
Monocytes Relative: 6 %
Neutro Abs: 10.8 10*3/uL — ABNORMAL HIGH (ref 1.7–7.7)
Neutrophils Relative %: 76 %
Platelets: 203 10*3/uL (ref 150–400)
RBC: 4.56 MIL/uL (ref 4.22–5.81)
RDW: 13.1 % (ref 11.5–15.5)
WBC: 14 10*3/uL — AB (ref 4.0–10.5)
nRBC: 0 % (ref 0.0–0.2)

## 2018-07-13 LAB — COMPREHENSIVE METABOLIC PANEL
ALT: 55 U/L — ABNORMAL HIGH (ref 0–44)
AST: 146 U/L — ABNORMAL HIGH (ref 15–41)
Albumin: 3.1 g/dL — ABNORMAL LOW (ref 3.5–5.0)
Alkaline Phosphatase: 47 U/L (ref 38–126)
Anion gap: 5 (ref 5–15)
BUN: 10 mg/dL (ref 6–20)
CALCIUM: 8.2 mg/dL — AB (ref 8.9–10.3)
CO2: 27 mmol/L (ref 22–32)
Chloride: 110 mmol/L (ref 98–111)
Creatinine, Ser: 0.64 mg/dL (ref 0.61–1.24)
GFR calc Af Amer: >60 mL/min (ref >60)
GFR calc non Af Amer: >60 mL/min (ref >60)
Glucose, Bld: 102 mg/dL — ABNORMAL HIGH (ref 70–99)
Potassium: 3.6 mmol/L (ref 3.5–5.1)
Sodium: 142 mmol/L (ref 135–145)
Total Bilirubin: 0.8 mg/dL (ref 0.3–1.2)
Total Protein: 5.4 g/dL — ABNORMAL LOW (ref 6.5–8.1)

## 2018-07-13 LAB — CK TOTAL AND CKMB (NOT AT ARMC)
CK, MB: 43 ng/mL — ABNORMAL HIGH (ref 0.5–5.0)
Relative Index: 1.1 (ref 0.0–2.5)
Total CK: 4064 U/L — ABNORMAL HIGH (ref 49–397)

## 2018-07-13 LAB — PHOSPHORUS: Phosphorus: 1.8 mg/dL — ABNORMAL LOW (ref 2.5–4.6)

## 2018-07-13 LAB — MAGNESIUM: Magnesium: 2.2 mg/dL (ref 1.7–2.4)

## 2018-07-13 MED ORDER — GUAIFENESIN-DM 100-10 MG/5ML PO SYRP
5.0000 mL | ORAL_SOLUTION | ORAL | Status: DC | PRN
  Administered 2018-07-13: 5 mL via ORAL
  Filled 2018-07-13: qty 10

## 2018-07-13 MED ORDER — SODIUM CHLORIDE 0.9 % IV SOLN
INTRAVENOUS | Status: DC
  Administered 2018-07-14 – 2018-07-15 (×4): via INTRAVENOUS

## 2018-07-13 MED ORDER — POTASSIUM PHOSPHATES 15 MMOLE/5ML IV SOLN
20.0000 mmol | Freq: Once | INTRAVENOUS | Status: AC
  Administered 2018-07-13: 20 mmol via INTRAVENOUS
  Filled 2018-07-13: qty 6.67

## 2018-07-13 MED ORDER — HYDROCOD POLST-CPM POLST ER 10-8 MG/5ML PO SUER: 5.0000 mL | Freq: Once | ORAL | Status: DC

## 2018-07-13 MED ORDER — SODIUM CHLORIDE 0.9 % IV SOLN
INTRAVENOUS | Status: AC
  Administered 2018-07-13: 03:00:00 via INTRAVENOUS

## 2018-07-13 NOTE — Progress Notes (Signed)
CSW consult acknowledged.  Psychiatrist recommend psychiatric Inpatient admission when medically cleared. Social Work will continue to follow this patient.   Vivi BarrackNicole Francy Mcilvaine, Alexander MtLCSW, MSW Clinical Social Worker  [phone] 07/13/2018  11:50 AM

## 2018-07-13 NOTE — Progress Notes (Signed)
PROGRESS NOTE    [name]  ZOX:[id number] DOB: 24-Jul-1991 DOA: 07/11/2018 PCP: Nathan Morton, No Pcp Per   Brief Narrative:  Nathan Morton is a 27 year old Caucasian male with past medical history significant for hypertension, history of IV drug abuse with multiple relapses who states he was clean for about 2 months until about 2 days ago when he did IV drug use.  And yesterday he intentionally overdosed with heroin and a suicide attempt.    He was recently behavioral health for suicidal thoughts and was discharged home with Seroquel has been only taking half the dose of Seroquel.  Also states that he is having a cough for about a week. Nathan Morton states Overdosed on his heroin and had a loss of consciousness and when he came to call his friend for help and his friend came and found on the bottom to the emergency room.    He was complaining of left hip pain with difficulty moving his left lower extremity due to the pain.  He is brought to the emergency room and had a head CT as well as CT of spine chest and abdomen.  His CT scan showed right upper sided infiltrate concerning for possible contusion versus pneumonia.  He had a leukocytosis 44,000 and blood cultures were ordered and he started on empiric antibiotics for pneumonia.    After admission he was stating that he had some hemoptysis and still was having suicidal thoughts but states that he wants help.  He was admitted for possible sepsis due to suspected pneumonia with versus a lung contusion.  Psychiatry was consulted because of the Nathan Morton's suicidal thoughts and recommend Inpatient Psychiatric Hospitalization once medically stable to D/C.    Because he had left lower extremity weakness and MRI of the brain was checked which was negative.  Pulmonology recommended supportive management for his possible lung contusion.  Urine did show some hematuria but there is also concern for some rhabdomyolysis as it is unknown how long the Nathan Morton was down on the  floor for. Nathan Morton's troponin was elevated but not likely indicative of ACS he did not complain any chest pain and we will cycle cardiac troponins which are trending down.  Nathan Morton states he is feeling better today.  Incidentally his LFTs were elevated today for right upper quadrant ultrasound was obtained which was normal.  Assessment & Plan:   Principal Problem:   Suicide attempt [facility]) Active Problems:   Bipolar 1 disorder, depressed, severe (HCC)   Polysubstance dependence (HCC)   CAP (community acquired pneumonia)   Sepsis (HCC)   ARF (acute renal failure) (HCC)  Sepsis from suspected pneumonia and also possibility of lung contusion  -Dr. Toniann Fail discussed with on-call pulmonologist who advised at this time since Nathan Morton's procalcitonin levels elevated Nathan Morton likely has pneumonia.   -Sepsis Physiology improving -WBC on Admission was 44.0 and is now improved to 14.0 -Strep Urine and Legionella Ag Negative -CT Scan showed "Patchy interstitial prominence occupying a significant portion of the right upper lobe and small amount of the superior segment of the right lower lobe. Given the history of blunt trauma, this is most likely due to pulmonary contusion. Interstitial pneumonitis can also have this appearance." -LA on Admission was 2.04 and improved to 1.4 -Procalcitonin was 5.43 -C/w IVF Hydration with NS at 100 mL/hr -Continue with IV Azithromycin and IV Ceftriaxone -He is also given IV vancomycin which I will Discontinue as MRSA PCR was Negative -Cultures x2 showed no growth at 1 day -Sputum Gram stain  showed few WBCs present, predominantly PMNs, moderate gram-negative rods, moderate gram-positive cocci and sputum culture is re-incubated better growth  Depression with Suicidal Thoughts  -C/w suicide precautions and 1:1  -Consulted Psychiatrist and recommend inpatient hospitalization when medically stable -Continue with Seroquel 75 mg p.o. nightly the recommendation  psychiatry -Once medically stable he will go to inpatient psychiatric facility  Fall/Left lower extremity weakness  -Likely in the setting after Nathan Morton OD'd -Head CT done and showed no acute skull fracture intracranial hemorrhage.  There is no cervical spine fracture subluxation there is mild to moderate chronic right maxillary sinusitis.  There is also minimal reversal of the normal cervical lordosis and minimal levoconvex cervical scoliosis. -MRI brain was checked and was Normal -Will get PT to work with the Nathan Morton   Probable lung contusion with Hemoptysis -Pulmonologist advised supportive management. -See Above  Hyperglycemia  -Checked Hemoglobin A1c and was 5.3 -Likely reactive -Continue to Monitor and blood sugars remain consistently elevated will place on sensitive NovoLog sliding scale insulin  IV drug Abuse  -Social work consulted to provide resources  Hematuria -Likely in the setting of rhabdomyolysis -Initial CK was 174 on admission but then elevated -Urinalysis showed hazy appearance with large hemoglobin, 20 ketones, trace leukocytes, 100 protein, rare bacteria, greater than RBCs per high-power field, 11-20 WBCs -We will repeat UA in the a.m.  Abnormal LFTs -Nathan Morton's AST went from 101 and is now 146 -ALT went from 25 is now 52 -We will check a upper quadrant ultrasound and acute hepatitis panel -Right upper quadrant ultrasound was unremarkable -Continue monitoring trend LFTs -Repeat CMP in a.m.  Hypophosphatemia -Nathan Morton's phosphorus level this morning is 1.8 -Replete with IV K-Phos 20 mmol -Continue to monitor replete as necessary -Repeat phosphorus level in a.m.  Rhabdomyolysis -Mild.  Nathan Morton overdosed on heroin and likely fell to the floor and is unclear how long he is on the floor for -CK total on admission was 3491 and has went up and is now 4064 -Continue with IV fluid hydration and continue monitor renal function -Continue to trend and repeat CK  CK-MB in a.m.  Elevated Troponin -Nathan Morton's troponin on admission was 0.28 -Likely in the setting of rhabdomyolysis -EKG on admission showed sinus tachycardia -Nathan Morton denied any chest pain and troponins are trending down and last one was 0.19 -Continue telemetry -Monitor for any ischemic changes  AKI.  Improved -Nathan Morton is BUN/creatinine on admission was 13/1.53 -Is improved now is 10/0.64 -Continue with IV fluid hydration with normal saline at rate 100 and mils per hour -Continue monitor and avoid nephrotoxic medication and contrast agents if possible -Repeat CMP in a.m.  DVT prophylaxis: SCDs given Hemoptysis  Code Status: FULL CODE Family Communication: No family present at bedside Disposition Plan: D/C to Inpatient Psychiatry when medically stable and improved from [place] and off of IVF   Consultants:   Psychiatry    Procedures: RUQ U/S   Antimicrobials:  Anti-infectives (From admission, onward)   Start     Dose/Rate Route Frequency Ordered Stop   07/13/18 0400  vancomycin (VANCOCIN) 1,750 mg in sodium chloride 0.9 % 500 mL IVPB  Status:  Discontinued     1,750 mg 250 mL/hr over 120 Minutes Intravenous Every 24 hours 07/12/18 0339 07/12/18 1452   07/13/18 0300  azithromycin (ZITHROMAX) 500 mg in sodium chloride 0.9 % 250 mL IVPB     500 mg 250 mL/hr over 60 Minutes Intravenous Every 24 hours 07/12/18 0327 07/20/18 0259   07/13/18 0200  cefTRIAXone (ROCEPHIN)  1 g in sodium chloride 0.9 % 100 mL IVPB     1 g 200 mL/hr over 30 Minutes Intravenous Every 24 hours 07/12/18 0327 07/20/18 0159   07/12/18 2000  vancomycin (VANCOCIN) 1,500 mg in sodium chloride 0.9 % 500 mL IVPB     1,500 mg 250 mL/hr over 120 Minutes Intravenous Every 12 hours 07/12/18 1452     07/12/18 0345  vancomycin (VANCOCIN) 1,750 mg in sodium chloride 0.9 % 500 mL IVPB     1,750 mg 250 mL/hr over 120 Minutes Intravenous STAT 07/12/18 0333 07/12/18 0838   07/12/18 0200  cefTRIAXone (ROCEPHIN) 1 g in  sodium chloride 0.9 % 100 mL IVPB     1 g 200 mL/hr over 30 Minutes Intravenous  Once 07/12/18 0151 07/12/18 0307   07/12/18 0200  azithromycin (ZITHROMAX) 500 mg in sodium chloride 0.9 % 250 mL IVPB     500 mg 250 mL/hr over 60 Minutes Intravenous  Once 07/12/18 0151 07/12/18 0420     Subjective: And examined at bedside was not feeling bad.  Denies any chest pain, lightheadedness or dizziness.  No nausea or vomiting.  States he is feeling better than yesterday and did get some sleep.  No other concerns or complaints at this time  Objective: Vitals:   07/13/18 0445 07/13/18 0552 07/13/18 0829 07/13/18 1433  BP:    121/71  Pulse:    81  Resp:      Temp:  100 F (37.8 C) 99.3 F (37.4 C) 99 F (37.2 C)  TempSrc:  Oral Oral Oral  SpO2:    100%  Weight: 95.6 kg     Height:        Intake/Output Summary (Last 24 hours) at 07/13/2018 1527 Last data filed at 07/13/2018 1610 Gross per 24 hour  Intake 3100 ml  Output -  Net 3100 ml   Filed Weights   07/11/18 2159 07/12/18 0832 07/13/18 0445  Weight: 88.5 kg 92.7 kg 95.6 kg   Examination: Physical Exam:  Constitutional: WN/WD obese Caucasian Male in NAD and appears calm  Eyes: Lids and conjunctivae normal, sclerae anicteric  ENMT: External Ears, Nose appear normal. Grossly normal hearing.  Neck: Appears normal, supple, no cervical masses, normal ROM, no appreciable thyromegaly; no JVD Respiratory: Diminished to auscultation bilaterally, no wheezing, rales, rhonchi or crackles. Normal respiratory effort and Nathan Morton is not tachypenic. No accessory muscle use. Unlabored Breathing  Cardiovascular: RRR, no murmurs / rubs / gallops. S1 and S2 auscultated. No extremity edema Abdomen: Soft, non-tender, non-distended. No masses palpated. No appreciable hepatosplenomegaly. Bowel sounds positive x4.  GU: Deferred. Musculoskeletal: No clubbing / cyanosis of digits/nails. Normal strength and muscle tone.  Skin: No rashes, lesions, ulcers on  a limited skin evaluation. No induration; Warm and dry. Has several tattoos scattered throughout the body include face Neurologic: CN 2-12 grossly intact with no focal deficits. Romberg sign and cerebellar reflexes not assessed.  Psychiatric: Normal judgment and insight. Alert and oriented x 3. Normal mood and appropriate affect.   Data Reviewed: I have personally reviewed following labs and imaging studies  CBC: Recent Labs  Lab 07/11/18 2246 07/12/18 0632 07/13/18 0433  WBC 44.0* 17.4* 14.0*  NEUTROABS 41.8* 14.7* 10.8*  HGB 17.9* 13.2 13.5  HCT 56.3* 41.4 41.7  MCV 93.7 93.0 91.4  PLT 332 214 203   Basic Metabolic Panel: Recent Labs  Lab 07/11/18 2246 07/12/18 0632 07/13/18 0433  NA 142 138 142  K 3.9 4.8 3.6  CL  98 104 110  CO2 22 28 27   GLUCOSE 209* 98 102*  BUN 13 12 10   CREATININE 1.53* 0.99 0.64  CALCIUM 9.1 7.4* 8.2*  MG  --   --  2.2  PHOS  --   --  1.8*   GFR: Estimated Creatinine Clearance: 163.7 mL/min (by C-G formula based on SCr of 0.64 mg/dL). Liver Function Tests: Recent Labs  Lab 07/11/18 2246 07/12/18 0632 07/13/18 0433  AST 40 101* 146*  ALT 32 35 55*  ALKPHOS 87 50 47  BILITOT 1.0 1.2 0.8  PROT 8.3* 5.2* 5.4*  ALBUMIN 4.9 3.1* 3.1*   No results for input(s): LIPASE, AMYLASE in the last 168 hours. No results for input(s): AMMONIA in the last 168 hours. Coagulation Profile: Recent Labs  Lab 07/12/18 0630  INR 1.12   Cardiac Enzymes: Recent Labs  Lab 07/11/18 2356 07/12/18 0632 07/12/18 1140 07/12/18 1736 07/12/18 2305 07/13/18 0433  CKTOTAL 174 3,491*  --   --   --  4,064*  CKMB  --   --   --   --   --  43.0*  TROPONINI  --  0.28* 0.21* 0.23* 0.19*  --    BNP (last 3 results) No results for input(s): PROBNP in the last 8760 hours. HbA1C: Recent Labs    07/12/18 0331  HGBA1C 5.3   CBG: No results for input(s): GLUCAP in the last 168 hours. Lipid Profile: No results for input(s): CHOL, HDL, LDLCALC, TRIG, CHOLHDL,  LDLDIRECT in the last 72 hours. Thyroid Function Tests: No results for input(s): TSH, T4TOTAL, FREET4, T3FREE, THYROIDAB in the last 72 hours. Anemia Panel: No results for input(s): VITAMINB12, FOLATE, FERRITIN, TIBC, IRON, RETICCTPCT in the last 72 hours. Sepsis Labs: Recent Labs  Lab 07/12/18 0052 07/12/18 0330 07/12/18 0331 07/12/18 0630  PROCALCITON  --   --  5.43  --   LATICACIDVEN 2.04* 1.2  --  1.4    Recent Results (from the past 240 hour(s))  Culture, blood (routine x 2)     Status: None (Preliminary result)   Collection Time: 07/12/18 12:27 AM  Result Value Ref Range Status   Specimen Description   Final    BLOOD LEFT ANTECUBITAL Performed at [facility], [address] [place], [place] [address]    Special Requests   Final    BOTTLES DRAWN AEROBIC AND ANAEROBIC Blood Culture adequate volume Performed at [facility], [address] [place], [place] [address]    Culture   Final    NO GROWTH 1 DAY Performed at [facility], [address], [place], [place] [address]    Report Status PENDING  Incomplete  Culture, blood (routine x 2)     Status: None (Preliminary result)   Collection Time: 07/12/18 12:27 AM  Result Value Ref Range Status   Specimen Description   Final    BLOOD BLOOD LEFT HAND Performed at [facility], [address], [place], [place] [address]    Special Requests   Final    BOTTLES DRAWN AEROBIC AND ANAEROBIC Blood Culture adequate volume Performed at [facility], [address] [place], [place] [address]    Culture   Final    NO GROWTH 1 DAY Performed at [facility], [address], [place], [place] [address]    Report Status PENDING  Incomplete  Culture, sputum-assessment     Status: None   Collection Time: 07/12/18  4:20 AM  Result Value Ref Range Status   Specimen Description SPUTUM  Final   Special Requests NONE  Final   Sputum evaluation    Final    Sputum specimen not acceptable for testing.  Please recollect.    NOTIFIED JULIE AT 0555 ON 07/12/18 BY A,MOHAMED Performed at [facility], [address] [place], [place] [address]    Report Status 07/12/2018 FINAL  Final  MRSA PCR Screening     Status: None   Collection Time: 07/12/18  4:20 AM  Result Value Ref Range Status   MRSA by PCR NEGATIVE NEGATIVE Final    Comment:        The GeneXpert MRSA Assay (FDA approved for NASAL specimens only), is one component of a comprehensive MRSA colonization surveillance program. It is not intended to diagnose MRSA infection nor to guide or monitor treatment for MRSA infections. Performed at [facility], [address], [place], [place] [address]   Expectorated sputum assessment w rflx to resp cult     Status: None   Collection Time: 07/12/18  6:46 AM  Result Value Ref Range Status   Specimen Description SPUTUM  Final   Special Requests NONE  Final   Sputum evaluation   Final    THIS SPECIMEN IS ACCEPTABLE FOR SPUTUM CULTURE Performed at [facility], [address] [place], [place] [address]    Report Status 07/12/2018 FINAL  Final  Culture, respiratory     Status: None (Preliminary result)   Collection Time: 07/12/18  6:46 AM  Result Value Ref Range Status   Specimen Description   Final    SPUTUM Performed at [facility], [address], [place], [place] [address]    Special Requests   Final    NONE Reflexed from [id number] Performed at [facility], [address] [place], [place] [address]    Gram Stain   Final    FEW WBC PRESENT, PREDOMINANTLY PMN MODERATE GRAM NEGATIVE RODS MODERATE GRAM POSITIVE COCCI    Culture   Final    CULTURE REINCUBATED FOR BETTER GROWTH Performed at [facility], [address] [place], [place] [address]    Report Status PENDING  Incomplete  Culture, blood (routine x 2) Call MD if  unable to obtain prior to antibiotics being given     Status: None (Preliminary result)   Collection Time: 07/12/18  9:18 AM  Result Value Ref Range Status   Specimen Description   Final    BLOOD RIGHT ARM Performed at [facility], [address], [place], [place] [address]    Special Requests   Final    BOTTLES DRAWN AEROBIC AND ANAEROBIC Blood Culture adequate volume Performed at [facility], [address] [place] [address], [place], [place] [address]    Culture   Final    NO GROWTH 1 DAY Performed at [facility], [address], [place], [place] [address]    Report Status PENDING  Incomplete    Radiology Studies: Ct Head Wo Contrast  Result Date: 07/12/2018 CLINICAL DATA:  Head trauma and neck pain following a fall. EXAM: CT HEAD WITHOUT CONTRAST CT CERVICAL SPINE WITHOUT CONTRAST TECHNIQUE: Multidetector CT imaging of the head and cervical spine was performed following the standard protocol without intravenous contrast. Multiplanar CT image reconstructions of the cervical spine were also generated. COMPARISON:  10/30/2010. FINDINGS: CT HEAD FINDINGS Brain: Normal appearing cerebral hemispheres and posterior fossa structures. Normal  size and position of the ventricles. No intracranial hemorrhage, mass lesion or CT evidence of acute infarction. Vascular: No hyperdense vessel or unexpected calcification. Skull: Normal. Negative for fracture or focal lesion. Sinuses/Orbits: Mild to moderate posterior maxillary sinus mucosal thickening on the right. Unremarkable orbits. Other: None. CT CERVICAL SPINE FINDINGS Alignment: Minimal reversal the normal cervical lordosis with mild progression and minimal levoconvex cervical scoliosis, not previously present. No subluxations. Skull base and vertebrae: No acute fracture. No primary bone lesion or focal pathologic process. Soft tissues and spinal canal: No prevertebral fluid or swelling. No visible canal hematoma. Disc levels:   Unremarkable. Upper chest: Interval patchy interstitial prominence at the right lung apex and minimally at the left lung apex posteriorly. Other: Choose 1 IMPRESSION: 1. No skull fracture or intracranial hemorrhage. 2. No cervical spine fracture or subluxation. 3. Mild to moderate chronic right maxillary sinusitis. 4. Minimal reversal of the normal cervical lordosis and minimal levoconvex cervical scoliosis, not previously present. 5. Interval patchy interstitial prominence at the right lung apex and minimally at the left lung apex posteriorly. This could be due to pulmonary contusions. This will be described in more detail on the chest CT report. Electronically Signed   By: Beckie SaltsSteven  Reid M.D.   On: 07/12/2018 01:25   Ct Chest W Contrast  Result Date: 07/12/2018 CLINICAL DATA:  Blunt chest and abdominal trauma with abdominal pain. EXAM: CT CHEST, ABDOMEN, AND PELVIS WITH CONTRAST TECHNIQUE: Multidetector CT imaging of the chest, abdomen and pelvis was performed following the standard protocol during bolus administration of intravenous contrast. CONTRAST:  100mL ISOVUE-300 IOPAMIDOL (ISOVUE-300) INJECTION 61% COMPARISON:  Portable chest obtained earlier today. Cervical spine CT obtained today. FINDINGS: CT CHEST FINDINGS Cardiovascular: No significant vascular findings. Normal heart size. No pericardial effusion. Mediastinum/Nodes: No enlarged mediastinal, hilar, or axillary lymph nodes. Thyroid gland, trachea, and esophagus demonstrate no significant findings. No mediastinal hemorrhage. Lungs/Pleura: Patchy interstitial prominence occupying a significant portion of the right upper lobe and small amount of the superior segment of the right lower lobe. Minimal bilateral dependent atelectasis. No pleural fluid or pneumothorax. Musculoskeletal: Normal appearing bones. No fractures. CT ABDOMEN PELVIS FINDINGS Hepatobiliary: No focal liver abnormality is seen. No gallstones, gallbladder wall thickening, or biliary  dilatation. Pancreas: Unremarkable. No pancreatic ductal dilatation or surrounding inflammatory changes. Spleen: Normal in size without focal abnormality. Adrenals/Urinary Tract: Small lower pole right renal cyst. Normal appearing left kidney, ureters, urinary bladder and adrenal glands. Stomach/Bowel: Stomach is within normal limits. Appendix appears normal. No evidence of bowel wall thickening, distention, or inflammatory changes. Vascular/Lymphatic: No significant vascular findings are present. No enlarged abdominal or pelvic lymph nodes. Reproductive: Prostate is unremarkable. Other: No abdominal wall hernia or abnormality. No abdominopelvic ascites. Musculoskeletal: Unremarkable bones. No fractures. IMPRESSION: 1. Patchy interstitial prominence occupying a significant portion of the right upper lobe and small amount of the superior segment of the right lower lobe. Given the history of blunt trauma, this is most likely due to pulmonary contusion. Interstitial pneumonitis can also have this appearance. 2. No acute abnormality in the abdomen or pelvis. Electronically Signed   By: Beckie SaltsSteven  Reid M.D.   On: 07/12/2018 01:33   Ct Cervical Spine Wo Contrast  Result Date: 07/12/2018 CLINICAL DATA:  Head trauma and neck pain following a fall. EXAM: CT HEAD WITHOUT CONTRAST CT CERVICAL SPINE WITHOUT CONTRAST TECHNIQUE: Multidetector CT imaging of the head and cervical spine was performed following the standard protocol without intravenous contrast. Multiplanar CT image reconstructions of the cervical  spine were also generated. COMPARISON:  10/30/2010. FINDINGS: CT HEAD FINDINGS Brain: Normal appearing cerebral hemispheres and posterior fossa structures. Normal size and position of the ventricles. No intracranial hemorrhage, mass lesion or CT evidence of acute infarction. Vascular: No hyperdense vessel or unexpected calcification. Skull: Normal. Negative for fracture or focal lesion. Sinuses/Orbits: Mild to moderate  posterior maxillary sinus mucosal thickening on the right. Unremarkable orbits. Other: None. CT CERVICAL SPINE FINDINGS Alignment: Minimal reversal the normal cervical lordosis with mild progression and minimal levoconvex cervical scoliosis, not previously present. No subluxations. Skull base and vertebrae: No acute fracture. No primary bone lesion or focal pathologic process. Soft tissues and spinal canal: No prevertebral fluid or swelling. No visible canal hematoma. Disc levels:  Unremarkable. Upper chest: Interval patchy interstitial prominence at the right lung apex and minimally at the left lung apex posteriorly. Other: Choose 1 IMPRESSION: 1. No skull fracture or intracranial hemorrhage. 2. No cervical spine fracture or subluxation. 3. Mild to moderate chronic right maxillary sinusitis. 4. Minimal reversal of the normal cervical lordosis and minimal levoconvex cervical scoliosis, not previously present. 5. Interval patchy interstitial prominence at the right lung apex and minimally at the left lung apex posteriorly. This could be due to pulmonary contusions. This will be described in more detail on the chest CT report. Electronically Signed   By: Beckie Salts M.D.   On: 07/12/2018 01:25   Mr Brain Wo Contrast  Result Date: 07/12/2018 CLINICAL DATA:  Drug overdose. EXAM: MRI HEAD WITHOUT CONTRAST TECHNIQUE: Multiplanar, multiecho pulse sequences of the brain and surrounding structures were obtained without intravenous contrast. COMPARISON:  CT same day.  MRI 06/02/2010. FINDINGS: Brain: The brain has a normal appearance without evidence of malformation, atrophy, old or acute small or large vessel infarction, mass lesion, hemorrhage, hydrocephalus or extra-axial collection. Vascular: Major vessels at the base of the brain show flow. Venous sinuses appear patent. Skull and upper cervical spine: Normal. Sinuses/Orbits: Clear/normal. Other: None significant. IMPRESSION: Normal examination. Electronically Signed    By: Paulina Fusi M.D.   On: 07/12/2018 07:28   Ct Abdomen Pelvis W Contrast  Result Date: 07/12/2018 CLINICAL DATA:  Blunt chest and abdominal trauma with abdominal pain. EXAM: CT CHEST, ABDOMEN, AND PELVIS WITH CONTRAST TECHNIQUE: Multidetector CT imaging of the chest, abdomen and pelvis was performed following the standard protocol during bolus administration of intravenous contrast. CONTRAST:  ISOVUE-300 IOPAMIDOL (ISOVUE-300) INJECTION 61% COMPARISON:  Portable chest obtained earlier today. Cervical spine CT obtained today. FINDINGS: CT CHEST FINDINGS Cardiovascular: No significant vascular findings. Normal heart size. No pericardial effusion. Mediastinum/Nodes: No enlarged mediastinal, hilar, or axillary lymph nodes. Thyroid gland, trachea, and esophagus demonstrate no significant findings. No mediastinal hemorrhage. Lungs/Pleura: Patchy interstitial prominence occupying a significant portion of the right upper lobe and small amount of the superior segment of the right lower lobe. Minimal bilateral dependent atelectasis. No pleural fluid or pneumothorax. Musculoskeletal: Normal appearing bones. No fractures. CT ABDOMEN PELVIS FINDINGS Hepatobiliary: No focal liver abnormality is seen. No gallstones, gallbladder wall thickening, or biliary dilatation. Pancreas: Unremarkable. No pancreatic ductal dilatation or surrounding inflammatory changes. Spleen: Normal in size without focal abnormality. Adrenals/Urinary Tract: Small lower pole right renal cyst. Normal appearing left kidney, ureters, urinary bladder and adrenal glands. Stomach/Bowel: Stomach is within normal limits. Appendix appears normal. No evidence of bowel wall thickening, distention, or inflammatory changes. Vascular/Lymphatic: No significant vascular findings are present. No enlarged abdominal or pelvic lymph nodes. Reproductive: Prostate is unremarkable. Other: No abdominal wall hernia or  abnormality. No abdominopelvic ascites.  Musculoskeletal: Unremarkable bones. No fractures. IMPRESSION: 1. Patchy interstitial prominence occupying a significant portion of the right upper lobe and small amount of the superior segment of the right lower lobe. Given the history of blunt trauma, this is most likely due to pulmonary contusion. Interstitial pneumonitis can also have this appearance. 2. No acute abnormality in the abdomen or pelvis. Electronically Signed   By: Beckie Salts M.D.   On: 07/12/2018 01:33   Dg Chest Port 1 View  Result Date: 07/12/2018 CLINICAL DATA:  Initial evaluation for acute overdose. EXAM: PORTABLE CHEST 1 VIEW COMPARISON:  Prior radiograph from 05/16/2018. FINDINGS: The cardiac and mediastinal silhouettes are stable in size and contour, and remain within normal limits. The lungs are normally inflated. No airspace consolidation, pleural effusion, or pulmonary edema is identified. No findings to suggest aspiration. There is no pneumothorax. No acute osseous abnormality identified. IMPRESSION: No active cardiopulmonary disease. Electronically Signed   By: Rise Mu M.D.   On: 07/12/2018 00:49   US Abdomen Limited Ruq  Result Date: 07/13/2018 CLINICAL DATA:  Abnormal LFTs EXAM: ULTRASOUND ABDOMEN LIMITED RIGHT UPPER QUADRANT COMPARISON:  CT 07/12/2018 FINDINGS: Gallbladder: No gallstones or wall thickening visualized. No sonographic Murphy sign noted by sonographer. Common bile duct: Diameter: Normal caliber, 2 mm Liver: No focal lesion identified. Within normal limits in parenchymal echogenicity. Portal vein is patent on color Doppler imaging with normal direction of blood flow towards the liver. IMPRESSION: Unremarkable right upper quadrant ultrasound. Electronically Signed   By: Charlett Nose M.D.   On: 07/13/2018 09:30   Scheduled Meds: . QUEtiapine  75 mg Oral QHS   Continuous Infusions: . sodium chloride    . azithromycin 500 mg (07/13/18 0258)  . cefTRIAXone (ROCEPHIN)  IV 1 g (07/13/18 0154)  .  potassium PHOSPHATE IVPB (in mmol) 20 mmol (07/13/18 1154)  . vancomycin 1,500 mg (07/13/18 0816)    LOS: 1 day   Merlene Laughter, DO [facility] PAGER is on AMION  If 7PM-7AM, please contact night-coverage [email or web address] Password [facility] 07/13/2018, 3:27 PM

## 2018-07-13 NOTE — Clinical Social Work Note (Signed)
Clinical Social Work Assessment  Patient Details  Name: Nathan Morton MRN: [id number] Date of Birth: 1991-03-05  Date of referral:  07/13/18               Reason for consult:  Discharge Planning, Facility Placement                Permission sought to share information with:  Facility Medical sales representativeContact Representative, Family Supports Permission granted to share information::  Yes, Verbal Permission Granted  Name::        Agency::  Psychiatric   Relationship::     Contact Information:     Housing/Transportation Living arrangements for the past 2 months:  Single Family Home([place] [facility]) Source of Information:  Patient Patient Interpreter Needed:  None Criminal Activity/Legal Involvement Pertinent to Current Situation/Hospitalization:  No - Comment as needed Significant Relationships:  Merchandiser, retailCommunity Support, Parents Lives with:  Self Do you feel safe going back to the place where you live?  Yes Need for family participation in patient care:  Yes (Comment)  Care giving concerns:   27 y.o. male with history of IV drug abuse states he overdosed with heroin and his friend brought him to the ER.  Patient states he overdosed with suicidal ideation.  Was recently in behavioral health for suicidal thoughts and was discharged home on Seroquel.  Patient states he only takes half the dose of Seroquel. After admission patient is having some hemoptysis.  Still has suicidal thoughts.   Social Worker assessment / plan:   Patient express concerns with his substance use. The patient reports he intentionally overdose on heroin after getting into an argument with his girlfriend and best friend. Patient express he would like to SA facility after inpatient psych. He [facility] and has informed them he will need treatment.  Patient informed CSW that his [name] and he is concern about going there. CSW will fax patient clinicals outside [place] area  and will discuss concerns with admission coordinator.   Plan:inpatient psychiatric facility   Employment status:  Unemployed Insurance information:  (Medicaid Potential ) PT Recommendations:  Not assessed at this time Information / Referral to community resources:  Inpatient Psychiatric Care (Comment Required)  Patient/Family's Response to care:  Agreeable and Responding well to care.   Patient/Family's Understanding of and Emotional Response to Diagnosis, Current Treatment, and Prognosis:  Patient has a good understanding of his current diagnosis and follow up treatment.   Emotional Assessment Appearance:  Appears stated age Attitude/Demeanor/Rapport:    Affect (typically observed):  Accepting, Calm Orientation:  Oriented to Place, Oriented to  Time, Oriented to Self, Oriented to Situation Alcohol / Substance use:  Not Applicable Psych involvement (Current and /or in the community):  No (Comment)  Discharge Needs  Concerns to be addressed:  Discharge Planning Concerns Readmission within the last 30 days:  No Current discharge risk:  Substance Abuse Barriers to Discharge:  Continued Medical Work up, Inadequate or no insurance, Active Substance Use   Clearance Nathan A Tamrah Victorino, LCSW 07/13/2018, 1:38 PM

## 2018-07-14 ENCOUNTER — Inpatient Hospital Stay (HOSPITAL_COMMUNITY): Payer: Self-pay

## 2018-07-14 LAB — CBC WITH DIFFERENTIAL/PLATELET
Abs Immature Granulocytes: 0.03 10*3/uL (ref 0.00–0.07)
Basophils Absolute: 0 10*3/uL (ref 0.0–0.1)
Basophils Relative: 0 %
EOS ABS: 0.2 10*3/uL (ref 0.0–0.5)
Eosinophils Relative: 2 %
HCT: 43.4 % (ref 39.0–52.0)
Hemoglobin: 14 g/dL (ref 13.0–17.0)
Immature Granulocytes: 0 %
LYMPHS PCT: 29 %
Lymphs Abs: 2.3 10*3/uL (ref 0.7–4.0)
MCH: 28.8 pg (ref 26.0–34.0)
MCHC: 32.3 g/dL (ref 30.0–36.0)
MCV: 89.3 fL (ref 80.0–100.0)
MONOS PCT: 7 %
Monocytes Absolute: 0.5 10*3/uL (ref 0.1–1.0)
Neutro Abs: 4.8 10*3/uL (ref 1.7–7.7)
Neutrophils Relative %: 62 %
Platelets: 224 10*3/uL (ref 150–400)
RBC: 4.86 MIL/uL (ref 4.22–5.81)
RDW: 12.7 % (ref 11.5–15.5)
WBC: 7.8 10*3/uL (ref 4.0–10.5)
nRBC: 0 % (ref 0.0–0.2)

## 2018-07-14 LAB — CK TOTAL AND CKMB (NOT AT ARMC)
CK, MB: 9.1 ng/mL — ABNORMAL HIGH (ref 0.5–5.0)
Relative Index: 0.4 (ref 0.0–2.5)
Total CK: 2324 U/L — ABNORMAL HIGH (ref 49–397)

## 2018-07-14 LAB — COMPREHENSIVE METABOLIC PANEL
ALT: 51 U/L — AB (ref 0–44)
AST: 93 U/L — ABNORMAL HIGH (ref 15–41)
Albumin: 3 g/dL — ABNORMAL LOW (ref 3.5–5.0)
Alkaline Phosphatase: 42 U/L (ref 38–126)
Anion gap: 8 (ref 5–15)
BUN: 9 mg/dL (ref 6–20)
CALCIUM: 8.5 mg/dL — AB (ref 8.9–10.3)
CO2: 25 mmol/L (ref 22–32)
Chloride: 109 mmol/L (ref 98–111)
Creatinine, Ser: 0.58 mg/dL — ABNORMAL LOW (ref 0.61–1.24)
GFR calc non Af Amer: >60 mL/min (ref >60)
GLUCOSE: 97 mg/dL (ref 70–99)
Potassium: 3.8 mmol/L (ref 3.5–5.1)
Sodium: 142 mmol/L (ref 135–145)
Total Bilirubin: 0.5 mg/dL (ref 0.3–1.2)
Total Protein: 5.3 g/dL — ABNORMAL LOW (ref 6.5–8.1)

## 2018-07-14 LAB — CULTURE, RESPIRATORY W GRAM STAIN: Culture: NORMAL

## 2018-07-14 LAB — HEPATITIS PANEL, ACUTE
HCV Ab: <0.1 s/co ratio (ref 0.0–0.9)
HEP A IGM: NEGATIVE
Hep B C IgM: NEGATIVE
Hepatitis B Surface Ag: NEGATIVE

## 2018-07-14 LAB — PHOSPHORUS: PHOSPHORUS: 4.4 mg/dL (ref 2.5–4.6)

## 2018-07-14 LAB — CULTURE, RESPIRATORY

## 2018-07-14 LAB — MAGNESIUM: Magnesium: 2 mg/dL (ref 1.7–2.4)

## 2018-07-14 MED ORDER — AMOXICILLIN-POT CLAVULANATE 875-125 MG PO TABS
1.0000 | ORAL_TABLET | Freq: Two times a day (BID) | ORAL | Status: DC
  Administered 2018-07-14 – 2018-07-16 (×5): 1 via ORAL
  Filled 2018-07-14 (×5): qty 1

## 2018-07-14 NOTE — Progress Notes (Signed)
Spoke with patient earlier this afternoon. Pt reports SI. Pt stated that he did not have a specific plan in place today but expressed regret of making friend aware of plan to harm himself. Pt went onto say that had he not spoken to his friend "it would be over already".

## 2018-07-14 NOTE — Progress Notes (Signed)
PROGRESS NOTE  SEATON HOFMANN WUJ:[id number] DOB: July 18, 1991 DOA: 07/11/2018 PCP: Patient, No Pcp Per  HPI/Recap of past 24 hours:  reports less pain No fever, no cough, no edema Continue to report suicidal ideation, sitter in room  Assessment/Plan: Principal Problem:   Suicide attempt [facility]) Active Problems:   Bipolar 1 disorder, depressed, severe (HCC)   Polysubstance dependence (HCC)   CAP (community acquired pneumonia)   Sepsis (HCC)   ARF (acute renal failure) (HCC)  Sepsis from suspected pneumonia and also possibility of lung contusion -CT Scan showed "Patchy interstitial prominence occupying a significant portion of the right upper lobe and small amount of the superior segment of the right lower lobe. Given the history of blunt trauma, this is most likely due to pulmonary contusion. Interstitial pneumonitis can also have this appearance." -He does has leukocytosis on presentation WBC 44 and lactic acidosis, report hemoptysis on presentation -He is started on vanc,Rocephin and Zithromax since admission due to elevated procalcitonin level,  -Vanco discontinued due to MRSA screen negative  -blood culture negative, sputum culture with normal flora, urine strep pneumo antigen and Legionella antigen negative -WBC normalized, hemoptysis resolved ,sepsis physiology resolving, DC Rocephin and Zithromax ,start on oral Augmentin to cvoer possible aspiration (emesis noted on close initial presentation)  -Telemetry unremarkabl,e patient is improving ,we will DC telemetry  AKI  -Likely due to sepsis and rhabdomyolysis -Creatinine on presentation 1.5 -Creatinine coming down to 0.58 today, continue hydration for another 24 hours  Rhabdomyolysis -He overdosed on heroine and woke up on the concrete, complaining of bilateral hip pain and abdominal pain on presentation --Elevated troponin and elevated AST/ALT likely due to rhabdomyolysis, hepatitis panel negative abdominal ultrasound no  acute findings -ck  Peaked at 4000, trending down, generalized pain is improving, continue hydration for another 24hrs  Fall/Left lower extremity weakness  -Likely in the setting after patient OD'd -Head CT done and showed no acute skull fracture intracranial hemorrhage.  There is no cervical spine fracture subluxation there is mild to moderate chronic right maxillary sinusitis.  There is also minimal reversal of the normal cervical lordosis and minimal levoconvex cervical scoliosis. -MRI brain was checked and was Normal -Will get PT to work with the Patient   Depression with Suicidal Thoughts  -C/w suicide precautions and 1:1  -Consulted Psychiatrist and recommend inpatient hospitalization when medically stable -Continue with Seroquel 75 mg p.o. nightly the recommendation psychiatry -Once medically stable he will go to inpatient psychiatric facility  IV drug Abuse -Social work consulted to provide resources  Code Status: full  Family Communication: patient   Disposition Plan: Inpatient psych placement, likely will be medically cleared tomorrow   Consultants:  Psychiatry  Procedures:  None  Antibiotics:  As above   Objective: BP 112/76 (BP Location: Right Arm)   Pulse 86   Temp 97.9 F (36.6 C) (Oral)   Resp 19   Ht 5\' 10"  (1.778 m)   Wt 92.8 kg   SpO2 98%   BMI 29.36 kg/m   Intake/Output Summary (Last 24 hours) at 07/14/2018 1437 Last data filed at 07/14/2018 1400 Gross per 24 hour  Intake 1151.58 ml  Output -  Net 1151.58 ml   Filed Weights   07/12/18 0832 07/13/18 0445 07/14/18 0500  Weight: 92.7 kg 95.6 kg 92.8 kg    Exam: Patient is examined daily including today on 07/14/2018, exams remain the same as of yesterday except that has changed    General:  NAD, Has several tattoos scattered  throughout the body include face  Cardiovascular: RRR  Respiratory: CTABL  Abdomen: Soft/ND/NT, positive BS  Musculoskeletal: No Edema  Neuro: alert,  oriented   Data Reviewed: Basic Metabolic Panel: Recent Labs  Lab 07/11/18 2246 07/12/18 0632 07/13/18 0433 07/14/18 0450  NA 142 138 142 142  K 3.9 4.8 3.6 3.8  CL 98 104 110 109  CO2 22 28 27 25   GLUCOSE 209* 98 102* 97  BUN 13 12 10 9   CREATININE 1.53* 0.99 0.64 0.58*  CALCIUM 9.1 7.4* 8.2* 8.5*  MG  --   --  2.2 2.0  PHOS  --   --  1.8* 4.4   Liver Function Tests: Recent Labs  Lab 07/11/18 2246 07/12/18 0632 07/13/18 0433 07/14/18 0450  AST 40 101* 146* 93*  ALT 32 35 55* 51*  ALKPHOS 87 50 47 42  BILITOT 1.0 1.2 0.8 0.5  PROT 8.3* 5.2* 5.4* 5.3*  ALBUMIN 4.9 3.1* 3.1* 3.0*   No results for input(s): LIPASE, AMYLASE in the last 168 hours. No results for input(s): AMMONIA in the last 168 hours. CBC: Recent Labs  Lab 07/11/18 2246 07/12/18 0632 07/13/18 0433 07/14/18 0450  WBC 44.0* 17.4* 14.0* 7.8  NEUTROABS 41.8* 14.7* 10.8* 4.8  HGB 17.9* 13.2 13.5 14.0  HCT 56.3* 41.4 41.7 43.4  MCV 93.7 93.0 91.4 89.3  PLT 332 214 203 224   Cardiac Enzymes:   Recent Labs  Lab 07/11/18 2356 07/12/18 0632 07/12/18 1140 07/12/18 1736 07/12/18 2305 07/13/18 0433 07/14/18 0450  CKTOTAL 174 3,491*  --   --   --  4,7824,064* 2,324*  CKMB  --   --   --   --   --  43.0* 9.1*  TROPONINI  --  0.28* 0.21* 0.23* 0.19*  --   --    BNP (last 3 results) No results for input(s): BNP in the last 8760 hours.  ProBNP (last 3 results) No results for input(s): PROBNP in the last 8760 hours.  CBG: No results for input(s): GLUCAP in the last 168 hours.  Recent Results (from the past 240 hour(s))  Culture, blood (routine x 2)     Status: None (Preliminary result)   Collection Time: 07/12/18 12:27 AM  Result Value Ref Range Status   Specimen Description   Final    BLOOD LEFT ANTECUBITAL Performed at [facility], [address] [place], [place] [address]    Special Requests   Final    BOTTLES DRAWN AEROBIC AND ANAEROBIC Blood Culture adequate  volume Performed at [facility], [address] [place], [place] [address]    Culture   Final    NO GROWTH 2 DAYS Performed at [facility], [address] [place], [place] [address]    Report Status PENDING  Incomplete  Culture, blood (routine x 2)     Status: None (Preliminary result)   Collection Time: 07/12/18 12:27 AM  Result Value Ref Range Status   Specimen Description   Final    BLOOD BLOOD LEFT HAND Performed at [facility], [address] [place], [place] [address]    Special Requests   Final    BOTTLES DRAWN AEROBIC AND ANAEROBIC Blood Culture adequate volume Performed at [facility], [address] [place], [place] [address]    Culture   Final    NO GROWTH 2 DAYS Performed at [facility], [address] [place], [place] [address]  Report Status PENDING  Incomplete  Culture, sputum-assessment     Status: None   Collection Time: 07/12/18  4:20 AM  Result Value Ref Range Status   Specimen Description SPUTUM  Final   Special Requests NONE  Final   Sputum evaluation   Final    Sputum specimen not acceptable for testing.  Please recollect.    NOTIFIED JULIE AT 0555 ON 07/12/18 BY A,MOHAMED Performed at [facility], [address] [place], [place] [address]    Report Status 07/12/2018 FINAL  Final  MRSA PCR Screening     Status: None   Collection Time: 07/12/18  4:20 AM  Result Value Ref Range Status   MRSA by PCR NEGATIVE NEGATIVE Final    Comment:        The GeneXpert MRSA Assay (FDA approved for NASAL specimens only), is one component of a comprehensive MRSA colonization surveillance program. It is not intended to diagnose MRSA infection nor to guide or monitor treatment for MRSA infections. Performed at [facility], [address] [place], [place] [address]   Expectorated sputum assessment w rflx to resp cult     Status: None   Collection  Time: 07/12/18  6:46 AM  Result Value Ref Range Status   Specimen Description SPUTUM  Final   Special Requests NONE  Final   Sputum evaluation   Final    THIS SPECIMEN IS ACCEPTABLE FOR SPUTUM CULTURE Performed at [facility], [address] [place], [place] [address]    Report Status 07/12/2018 FINAL  Final  Culture, respiratory     Status: None   Collection Time: 07/12/18  6:46 AM  Result Value Ref Range Status   Specimen Description   Final    SPUTUM Performed at [facility], [address] [place], [place] [address]    Special Requests   Final    NONE Reflexed from [id number] Performed at [facility], [address] [place], [place] [address]    Gram Stain   Final    FEW WBC PRESENT, PREDOMINANTLY PMN MODERATE GRAM NEGATIVE RODS MODERATE GRAM POSITIVE COCCI    Culture   Final    Consistent with normal respiratory flora. Performed at [facility], [address], [place], [place] [address]    Report Status 07/14/2018 FINAL  Final  Culture, blood (routine x 2) Call MD if unable to obtain prior to antibiotics being given     Status: None (Preliminary result)   Collection Time: 07/12/18  9:18 AM  Result Value Ref Range Status   Specimen Description   Final    BLOOD RIGHT ARM Performed at [facility], [address] [place], [place] [address]    Special Requests   Final    BOTTLES DRAWN AEROBIC AND ANAEROBIC Blood Culture adequate volume Performed at [facility], [address], [place], [place] [address]    Culture   Final    NO GROWTH 2 DAYS Performed at [facility], [address], [place], [place] [address]    Report Status PENDING  Incomplete     Studies: Dg Chest Port 1 View  Result Date: 07/14/2018 CLINICAL DATA:  Shortness of Breath EXAM: PORTABLE CHEST 1 VIEW COMPARISON:  07/12/2018 FINDINGS: Cardiac shadows within normal limits. The lungs are  well aerated bilaterally without focal infiltrate or sizable effusion. The faint interstitial changes seen on recent CT are not well appreciated on  today's exam. No acute bony abnormality is noted. IMPRESSION: No acute abnormality seen. Electronically Signed   By: Alcide Clever M.D.   On: 07/14/2018 09:04    Scheduled Meds: . amoxicillin-clavulanate  1 tablet Oral Q12H  . QUEtiapine  75 mg Oral QHS    Continuous Infusions: . sodium chloride 100 mL/hr at 07/14/18 1327     Time spent: I have personally reviewed and interpreted on  07/14/2018 daily labs, tele strips, imagings as discussed above under date review session and assessment and plans.  I reviewed all nursing notes, pharmacy notes, consultant notes,  vitals, pertinent old records  I have discussed plan of care as described above with RN , patient  on 07/14/2018   Albertine Grates MD, PhD  [facility] Pager [phone]. If 7PM-7AM, please contact night-coverage at [email or web address], password [facility] 07/14/2018, 2:37 PM  LOS: 2 days

## 2018-07-15 NOTE — Progress Notes (Signed)
PROGRESS NOTE  Nathan Morton ZOX:[id number] DOB: 1990-09-13 DOA: 07/11/2018 PCP: Patient, No Pcp Per  HPI/Recap of past 24 hours:  reports less pain, feeling better No fever, no cough, no edema Continue to report suicidal ideation, sitter in room  Assessment/Plan: Principal Problem:   Suicide attempt [facility]) Active Problems:   Bipolar 1 disorder, depressed, severe (HCC)   Polysubstance dependence (HCC)   CAP (community acquired pneumonia)   Sepsis (HCC)   ARF (acute renal failure) (HCC)  Sepsis from suspected pneumonia and also possibility of lung contusion -CT Scan showed "Patchy interstitial prominence occupying a significant portion of the right upper lobe and small amount of the superior segment of the right lower lobe. Given the history of blunt trauma, this is most likely due to pulmonary contusion. Interstitial pneumonitis can also have this appearance." -He does has leukocytosis on presentation WBC 44 and lactic acidosis, report hemoptysis on presentation -He is started on vanc,Rocephin and Zithromax since admission due to elevated procalcitonin level,  -Vanco discontinued due to MRSA screen negative  -blood culture negative, sputum culture with normal flora, urine strep pneumo antigen and Legionella antigen negative -WBC normalized, hemoptysis resolved ,sepsis physiology resolving, DC Rocephin and Zithromax ,start on oral Augmentin to cvoer possible aspiration (emesis noted on close initial presentation)    AKI  -Likely due to sepsis and rhabdomyolysis -Creatinine on presentation 1.5 -Creatinine normalized -DC IV fluids, encourage oral intake   Rhabdomyolysis -He overdosed on heroine and woke up on the concrete, complaining of bilateral hip pain and abdominal pain on presentation --Elevated troponin and elevated AST/ALT likely due to rhabdomyolysis, hepatitis panel negative abdominal ultrasound no acute findings -ck  Peaked at 4000, trending down, generalized pain  is improving, -Improved, DC hydration encourage oral intake   Fall/Left lower extremity weakness  -Likely in the setting after patient OD'd -Head CT done and showed no acute skull fracture intracranial hemorrhage.  There is no cervical spine fracture subluxation there is mild to moderate chronic right maxillary sinusitis.  There is also minimal reversal of the normal cervical lordosis and minimal levoconvex cervical scoliosis. -MRI brain was checked and was Normal -Will get PT to work with the Patient   Depression with Suicidal Thoughts  -C/w suicide precautions and 1:1  -Consulted Psychiatrist and recommend inpatient hospitalization when medically stable -Continue with Seroquel 75 mg p.o. nightly the recommendation psychiatry -Once medically stable he will go to inpatient psychiatric facility  IV drug Abuse -Social work consulted to provide resources  Code Status: full  Family Communication: patient   Disposition Plan: patient is  medically cleared to discharge to Inpatient psych placement Awaiting for bed  Consultants:  Psychiatry  Procedures:  None  Antibiotics:  As above   Objective: BP (!) 112/59   Pulse 67   Temp 98.1 F (36.7 C) (Oral)   Resp 16   Ht 5\' 10"  (1.778 m)   Wt 89.6 kg   SpO2 92%   BMI 28.34 kg/m   Intake/Output Summary (Last 24 hours) at 07/15/2018 1824 Last data filed at 07/15/2018 1400 Gross per 24 hour  Intake 2310 ml  Output -  Net 2310 ml   Filed Weights   07/13/18 0445 07/14/18 0500 07/15/18 0649  Weight: 95.6 kg 92.8 kg 89.6 kg    Exam: Patient is examined daily including today on 07/15/2018, exams remain the same as of yesterday except that has changed    General:  NAD, Has several tattoos scattered throughout the body include face  Cardiovascular: RRR  Respiratory: CTABL  Abdomen: Soft/ND/NT, positive BS  Musculoskeletal: No Edema  Neuro: alert, oriented   Data Reviewed: Basic Metabolic Panel: Recent Labs    Lab 07/11/18 2246 07/12/18 0632 07/13/18 0433 07/14/18 0450  NA 142 138 142 142  K 3.9 4.8 3.6 3.8  CL 98 104 110 109  CO2 22 28 27 25   GLUCOSE 209* 98 102* 97  BUN 13 12 10 9   CREATININE 1.53* 0.99 0.64 0.58*  CALCIUM 9.1 7.4* 8.2* 8.5*  MG  --   --  2.2 2.0  PHOS  --   --  1.8* 4.4   Liver Function Tests: Recent Labs  Lab 07/11/18 2246 07/12/18 0632 07/13/18 0433 07/14/18 0450  AST 40 101* 146* 93*  ALT 32 35 55* 51*  ALKPHOS 87 50 47 42  BILITOT 1.0 1.2 0.8 0.5  PROT 8.3* 5.2* 5.4* 5.3*  ALBUMIN 4.9 3.1* 3.1* 3.0*   No results for input(s): LIPASE, AMYLASE in the last 168 hours. No results for input(s): AMMONIA in the last 168 hours. CBC: Recent Labs  Lab 07/11/18 2246 07/12/18 0632 07/13/18 0433 07/14/18 0450  WBC 44.0* 17.4* 14.0* 7.8  NEUTROABS 41.8* 14.7* 10.8* 4.8  HGB 17.9* 13.2 13.5 14.0  HCT 56.3* 41.4 41.7 43.4  MCV 93.7 93.0 91.4 89.3  PLT 332 214 203 224   Cardiac Enzymes:   Recent Labs  Lab 07/11/18 2356 07/12/18 0632 07/12/18 1140 07/12/18 1736 07/12/18 2305 07/13/18 0433 07/14/18 0450  CKTOTAL 174 3,491*  --   --   --  1,6104,064* 2,324*  CKMB  --   --   --   --   --  43.0* 9.1*  TROPONINI  --  0.28* 0.21* 0.23* 0.19*  --   --    BNP (last 3 results) No results for input(s): BNP in the last 8760 hours.  ProBNP (last 3 results) No results for input(s): PROBNP in the last 8760 hours.  CBG: No results for input(s): GLUCAP in the last 168 hours.  Recent Results (from the past 240 hour(s))  Culture, blood (routine x 2)     Status: None (Preliminary result)   Collection Time: 07/12/18 12:27 AM  Result Value Ref Range Status   Specimen Description   Final    BLOOD LEFT ANTECUBITAL Performed at [facility], [address] [place], [place] [address]    Special Requests   Final    BOTTLES DRAWN AEROBIC AND ANAEROBIC Blood Culture adequate volume Performed at [facility], [address]  [place], [place] [address]    Culture   Final    NO GROWTH 3 DAYS Performed at [facility], [address] [place], [place] [address]    Report Status PENDING  Incomplete  Culture, blood (routine x 2)     Status: None (Preliminary result)   Collection Time: 07/12/18 12:27 AM  Result Value Ref Range Status   Specimen Description   Final    BLOOD BLOOD LEFT HAND Performed at [facility], [address] [place], [place] [address]    Special Requests   Final    BOTTLES DRAWN AEROBIC AND ANAEROBIC Blood Culture adequate volume Performed at [facility], [address] [place], [place] [address]    Culture   Final    NO GROWTH 3 DAYS Performed at [facility], [address] [place], [place] [address]    Report Status PENDING  Incomplete  Culture, sputum-assessment     Status: None   Collection Time: 07/12/18  4:20 AM  Result Value Ref Range Status   Specimen Description SPUTUM  Final   Special Requests NONE  Final   Sputum evaluation   Final    Sputum specimen not acceptable for testing.  Please recollect.    NOTIFIED JULIE AT 0555 ON 07/12/18 BY A,MOHAMED Performed at [facility], [address], [place], [place] [address]    Report Status 07/12/2018 FINAL  Final  MRSA PCR Screening     Status: None   Collection Time: 07/12/18  4:20 AM  Result Value Ref Range Status   MRSA by PCR NEGATIVE NEGATIVE Final    Comment:        The GeneXpert MRSA Assay (FDA approved for NASAL specimens only), is one component of a comprehensive MRSA colonization surveillance program. It is not intended to diagnose MRSA infection nor to guide or monitor treatment for MRSA infections. Performed at [facility], [address], [place], [place] [address]   Expectorated sputum assessment w rflx to resp cult     Status: None   Collection Time: 07/12/18  6:46 AM  Result Value Ref Range Status   Specimen  Description SPUTUM  Final   Special Requests NONE  Final   Sputum evaluation   Final    THIS SPECIMEN IS ACCEPTABLE FOR SPUTUM CULTURE Performed at [facility], [address], [place], [place] [address]    Report Status 07/12/2018 FINAL  Final  Culture, respiratory     Status: None   Collection Time: 07/12/18  6:46 AM  Result Value Ref Range Status   Specimen Description   Final    SPUTUM Performed at [facility], [address], [place], [place] [address]    Special Requests   Final    NONE Reflexed from [id number] Performed at [facility], [address], [place], [place] [address]    Gram Stain   Final    FEW WBC PRESENT, PREDOMINANTLY PMN MODERATE GRAM NEGATIVE RODS MODERATE GRAM POSITIVE COCCI    Culture   Final    Consistent with normal respiratory flora. Performed at [facility], [address] [place], [place] [address]    Report Status 07/14/2018 FINAL  Final  Culture, blood (routine x 2) Call MD if unable to obtain prior to antibiotics being given     Status: None (Preliminary result)   Collection Time: 07/12/18  9:18 AM  Result Value Ref Range Status   Specimen Description   Final    BLOOD RIGHT ARM Performed at [facility], [address], [place], [place] [address]    Special Requests   Final    BOTTLES DRAWN AEROBIC AND ANAEROBIC Blood Culture adequate volume Performed at [facility], [address], [place], [place] [address]    Culture   Final    NO GROWTH 3 DAYS Performed at [facility], [address], [place], [place] [address]    Report Status PENDING  Incomplete     Studies: No results found.  Scheduled Meds: . amoxicillin-clavulanate  1 tablet Oral Q12H  . QUEtiapine  75 mg Oral QHS    Continuous Infusions: . sodium chloride 100 mL/hr at 07/15/18 2725     Time spent: I have personally reviewed and interpreted on   07/15/2018 daily labs,  imagings as discussed above under date review  session and assessment and plans.  I reviewed all nursing notes, pharmacy notes, consultant notes,  vitals, pertinent old records  I have discussed plan of care as described above with RN , patient  on 07/15/2018   Albertine Grates MD, PhD  [facility] Pager [phone]. If 7PM-7AM, please contact night-coverage at [email or web address], password [email or web address] 07/15/2018, 6:24 PM  LOS: 3 days

## 2018-07-16 ENCOUNTER — Other Ambulatory Visit: Payer: Self-pay

## 2018-07-16 ENCOUNTER — Inpatient Hospital Stay (HOSPITAL_COMMUNITY)
Admission: AD | Admit: 2018-07-16 | Discharge: 2018-07-21 | DRG: 885 | Disposition: A | Discharge status: Home / Self Care | Payer: Federal, State, Local not specified - Other | Source: Intra-hospital | Attending: Psychiatry | Admitting: Psychiatry

## 2018-07-16 ENCOUNTER — Encounter (HOSPITAL_COMMUNITY): Payer: Self-pay

## 2018-07-16 DIAGNOSIS — Z811 Family history of alcohol abuse and dependence: Secondary | ICD-10-CM | POA: Diagnosis not present

## 2018-07-16 DIAGNOSIS — F17210 Nicotine dependence, cigarettes, uncomplicated: Secondary | ICD-10-CM | POA: Diagnosis not present

## 2018-07-16 DIAGNOSIS — R45851 Suicidal ideations: Secondary | ICD-10-CM | POA: Diagnosis not present

## 2018-07-16 DIAGNOSIS — F3130 Bipolar disorder, current episode depressed, mild or moderate severity, unspecified: Secondary | ICD-10-CM | POA: Diagnosis present

## 2018-07-16 DIAGNOSIS — F1120 Opioid dependence, uncomplicated: Secondary | ICD-10-CM | POA: Diagnosis present

## 2018-07-16 DIAGNOSIS — F332 Major depressive disorder, recurrent severe without psychotic features: Principal | ICD-10-CM | POA: Diagnosis present

## 2018-07-16 DIAGNOSIS — I10 Essential (primary) hypertension: Secondary | ICD-10-CM | POA: Diagnosis present

## 2018-07-16 DIAGNOSIS — Z888 Allergy status to other drugs, medicaments and biological substances status: Secondary | ICD-10-CM

## 2018-07-16 DIAGNOSIS — Z818 Family history of other mental and behavioral disorders: Secondary | ICD-10-CM | POA: Diagnosis not present

## 2018-07-16 DIAGNOSIS — F419 Anxiety disorder, unspecified: Secondary | ICD-10-CM | POA: Diagnosis not present

## 2018-07-16 DIAGNOSIS — F1124 Opioid dependence with opioid-induced mood disorder: Secondary | ICD-10-CM

## 2018-07-16 DIAGNOSIS — Z915 Personal history of self-harm: Secondary | ICD-10-CM | POA: Diagnosis not present

## 2018-07-16 MED ORDER — QUETIAPINE FUMARATE 50 MG PO TABS
75.0000 mg | ORAL_TABLET | Freq: Every day | ORAL | Status: DC
  Administered 2018-07-16 – 2018-07-18 (×3): 75 mg via ORAL
  Filled 2018-07-16 (×5): qty 1

## 2018-07-16 MED ORDER — TRAZODONE HCL 50 MG PO TABS: 50.0000 mg | ORAL_TABLET | Freq: Every evening | ORAL | Status: DC | PRN

## 2018-07-16 MED ORDER — HYDROXYZINE HCL 25 MG PO TABS
25.0000 mg | ORAL_TABLET | Freq: Three times a day (TID) | ORAL | Status: DC | PRN
  Administered 2018-07-17 – 2018-07-20 (×2): 25 mg via ORAL
  Filled 2018-07-16: qty 20
  Filled 2018-07-16 (×2): qty 1

## 2018-07-16 MED ORDER — AMOXICILLIN-POT CLAVULANATE 875-125 MG PO TABS
1.0000 | ORAL_TABLET | Freq: Two times a day (BID) | ORAL | Status: AC
  Administered 2018-07-16 – 2018-07-19 (×6): 1 via ORAL
  Filled 2018-07-16 (×9): qty 1

## 2018-07-16 MED ORDER — ACETAMINOPHEN 325 MG PO TABS
650.0000 mg | ORAL_TABLET | Freq: Four times a day (QID) | ORAL | Status: DC | PRN
  Administered 2018-07-16: 650 mg via ORAL
  Filled 2018-07-16: qty 2

## 2018-07-16 MED ORDER — ALUM & MAG HYDROXIDE-SIMETH 200-200-20 MG/5ML PO SUSP: 30.0000 mL | ORAL | Status: DC | PRN

## 2018-07-16 MED ORDER — MAGNESIUM HYDROXIDE 400 MG/5ML PO SUSP: 30.0000 mL | Freq: Every day | ORAL | Status: DC | PRN

## 2018-07-16 MED ORDER — DIVALPROEX SODIUM ER 250 MG PO TB24
250.0000 mg | ORAL_TABLET | Freq: Every day | ORAL | Status: DC
  Administered 2018-07-16 – 2018-07-17 (×2): 250 mg via ORAL
  Filled 2018-07-16 (×4): qty 1

## 2018-07-16 MED ORDER — ONDANSETRON HCL 4 MG PO TABS: 4.0000 mg | ORAL_TABLET | Freq: Four times a day (QID) | ORAL | Status: DC | PRN

## 2018-07-16 NOTE — Progress Notes (Addendum)
CSW provided referral to [facility] and [facility]. Patient information currently under review.  CSW will continue to follow.    Patient accepted to [facility].  Room 306 Bed 1 Accepting Physician Dr.Cobos Nurse call report to [phone] before patient leaves.  Voluntary form signed and sent to  [phone]  [facility] to transport.    Vivi BarrackNicole Alfrieda Tarry, Alexander MtLCSW, MSW Clinical Social Worker  [phone] 07/16/2018  9:27 AM

## 2018-07-16 NOTE — [facility] Suicide Risk Assessment (Signed)
[facility] INPATIENT:  Family/Significant Other Suicide Prevention Education  Suicide Prevention Education:  Patient Refusal for Family/Significant Other Suicide Prevention Education: The patient Nathan Morton has refused to provide written consent for family/significant other to be provided Family/Significant Other Suicide Prevention Education during admission and/or prior to discharge.  Physician notified.  SPE completed with pt, as pt refused to consent to family contact. SPI pamphlet provided to pt and pt was encouraged to share information with support network, ask questions, and talk about any concerns relating to SPE. Pt denies access to guns/firearms and verbalized understanding of information provided. Mobile Crisis information also provided to pt.   Rona RavensHeather S Merwyn Hodapp LCSW 07/16/2018, 12:30 PM

## 2018-07-16 NOTE — Progress Notes (Signed)
CSW and pt left message for June in admissions at [facility] in attempt to schedule screening. CSW faxed pt's clinicals to June.  Nathan Morton, MSW, LCSW Clinical Social Worker 07/16/2018 1:51 PM
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
## 2018-07-16 NOTE — Discharge Summary (Signed)
Discharge Summary  Nathan Morton ZOX:[id number] DOB: 1991-05-25  PCP: Patient, No Pcp Per  Admit date: 07/11/2018 Discharge date: 07/16/2018  Time spent:  Discharge to inpatient psych  Discharge Diagnoses:  Active Hospital Problems   Diagnosis Date Noted  . Suicide attempt (HCC)   . CAP (community acquired pneumonia) 07/12/2018  . Sepsis (HCC) 07/12/2018  . ARF (acute renal failure) (HCC) 07/12/2018  . Polysubstance dependence (HCC) 06/29/2018  . Bipolar 1 disorder, depressed, severe (HCC) 06/29/2018    Resolved Hospital Problems  No resolved problems to display.    Discharge Condition: stable  Diet recommendation: regular diet  Filed Weights   07/13/18 0445 07/14/18 0500 07/15/18 0649  Weight: 95.6 kg 92.8 kg 89.6 kg    History of present illness:  PCP: Patient, No Pcp Per  Patient coming from: Home.  Chief Complaint: Loss of consciousness.  HPI: Nathan Morton is a 27 y.o. male with history of IV drug abuse states he overdosed with heroin and his friend brought him to the ER.  Patient states he overdosed with suicidal ideation.  Was recently in behavioral health for suicidal thoughts and was discharged home on Seroquel.  Patient states he only takes half the dose of Seroquel.  Has been having cough for last 1 week.  Denies any chest pain or shortness of breath.  Denies any fever chills.   After the loss of consciousness patient is complaining of left hip area pain with difficulty moving his left lower extremity due to pain.  ED Course: In the ER patient had CT head C-spine chest and abdomen.  Which shows right-sided upper lobe infiltrates concerning for possible contusion versus pneumonia.  Patient's lab work show significant leukocytosis of 44,000.  Blood cultures were obtained and started on empiric antibiotics for pneumonia.  After admission patient is having some hemoptysis.  Still has suicidal thoughts.  Hospital Course:  Principal Problem:   Suicide  attempt [facility]) Active Problems:   Bipolar 1 disorder, depressed, severe (HCC)   Polysubstance dependence (HCC)   CAP (community acquired pneumonia)   Sepsis (HCC)   ARF (acute renal failure) (HCC)   Sepsisfrom suspectedpneumonia and also possibility of lung contusion -CT Scan showed "Patchy interstitial prominence occupying a significant portion of the right upper lobe and small amount of the superior segment of the right lower lobe. Given the history of blunt trauma, this is most likely due to pulmonary contusion. Interstitial pneumonitis can also have this appearance." -He does has leukocytosis on presentation WBC 44 and lactic acidosis, report hemoptysis on presentation -He is started on vanc,Rocephin and Zithromax since admission due to elevated procalcitonin level,  -Vanco discontinued due to MRSA screen negative  -blood culture negative, sputum culture with normal flora, urine strep pneumo antigen and Legionella antigen negative -WBC normalized, hemoptysis resolved ,sepsis physiology resolved, DC Rocephin and Zithromax start on oral Augmentin to cover possible aspiration (emesis noted on close initial presentation), total of 7 days of abx treatment    AKI  -Likely due to sepsis and rhabdomyolysis -Creatinine on presentation 1.5 -Creatinine normalized -DC IV fluids, encourage oral intake   Rhabdomyolysis -He overdosed on heroine and woke up on the concrete, complaining of bilateral hip pain and abdominal pain on presentation --Elevated troponin and elevated AST/ALT likely due to rhabdomyolysis, hepatitis panel negative abdominal ultrasound no acute findings -ck  Peaked at 4000, trending down, generalized pain is improving, -Improved, DC hydration encourage oral intake   Fall/Left lower extremity weakness -Likely in the setting  after patient OD'd -Head CT done and showed no acute skull fracture intracranial hemorrhage. There is no cervical spine fracture subluxation  there is mild to moderate chronic right maxillary sinusitis. There is also minimal reversal of the normal cervical lordosis and minimal levoconvex cervical scoliosis. -MRI brainwas checked and was Normal -he ambulated with steady gait  Depression withSuicidalThoughts -C/wsuicide precautions and 1:1  -Consulted Psychiatrist andrecommend inpatient hospitalization when medically stable -Continue with Seroquel 75 mg p.o. nightly the recommendation psychiatry - inpatient psychiatric facility  IV drugAbuse -Social work consulted to provide resources  Code Status: full  Family Communication: patient   Disposition Plan: patient is  medically cleared to discharge to Inpatient psych placement   Consultants:  Psychiatry  Procedures:  None  Antibiotics:  As above   Discharge Exam: BP (!) 100/59 (BP Location: Left Arm)   Pulse 76   Temp 98.2 F (36.8 C) (Oral)   Resp 16   Ht 5\' 10"  (1.778 m)   Wt 89.6 kg   SpO2 95%   BMI 28.34 kg/m    General:  NAD, Has several tattoos scattered throughout the body include face  Cardiovascular: RRR  Respiratory: CTABL  Abdomen: Soft/ND/NT, positive BS  Musculoskeletal: No Edema  Neuro: alert, oriented    Discharge Instructions You were cared for by a hospitalist during your hospital stay. If you have any questions about your discharge medications or the care you received while you were in the hospital after you are discharged, you can call the unit and asked to speak with the hospitalist on call if the hospitalist that took care of you is not available. Once you are discharged, your primary care physician will handle any further medical issues. Please note that NO REFILLS for any discharge medications will be authorized once you are discharged, as it is imperative that you return to your primary care physician (or establish a relationship with a primary care physician if you do not have one) for your aftercare needs so  that they can reassess your need for medications and monitor your lab values.  Discharge Instructions    Diet - low sodium heart healthy   Complete by:  As directed    Increase activity slowly   Complete by:  As directed      Allergies as of 07/16/2018      Reactions   Adderall [amphetamine-dextroamphetamine] Rash      Medication List    ASK your doctor about these medications   hydrOXYzine 50 MG tablet Commonly known as:  ATARAX/VISTARIL Take 1 tablet (50 mg total) by mouth every 6 (six) hours as needed for anxiety.   naltrexone 50 MG tablet Commonly known as:  DEPADE Take 1 tablet (50 mg total) by mouth daily.   QUEtiapine 100 MG tablet Commonly known as:  SEROQUEL Take 1.5 tablets (150 mg total) by mouth at bedtime.      Allergies  Allergen Reactions  . Adderall [Amphetamine-Dextroamphetamine] Rash      The results of significant diagnostics from this hospitalization (including imaging, microbiology, ancillary and laboratory) are listed below for reference.    Significant Diagnostic Studies: Ct Head Wo Contrast  Result Date: 07/12/2018 CLINICAL DATA:  Head trauma and neck pain following a fall. EXAM: CT HEAD WITHOUT CONTRAST CT CERVICAL SPINE WITHOUT CONTRAST TECHNIQUE: Multidetector CT imaging of the head and cervical spine was performed following the standard protocol without intravenous contrast. Multiplanar CT image reconstructions of the cervical spine were also generated. COMPARISON:  10/30/2010. FINDINGS: CT  HEAD FINDINGS Brain: Normal appearing cerebral hemispheres and posterior fossa structures. Normal size and position of the ventricles. No intracranial hemorrhage, mass lesion or CT evidence of acute infarction. Vascular: No hyperdense vessel or unexpected calcification. Skull: Normal. Negative for fracture or focal lesion. Sinuses/Orbits: Mild to moderate posterior maxillary sinus mucosal thickening on the right. Unremarkable orbits. Other: None. CT CERVICAL  SPINE FINDINGS Alignment: Minimal reversal the normal cervical lordosis with mild progression and minimal levoconvex cervical scoliosis, not previously present. No subluxations. Skull base and vertebrae: No acute fracture. No primary bone lesion or focal pathologic process. Soft tissues and spinal canal: No prevertebral fluid or swelling. No visible canal hematoma. Disc levels:  Unremarkable. Upper chest: Interval patchy interstitial prominence at the right lung apex and minimally at the left lung apex posteriorly. Other: Choose 1 IMPRESSION: 1. No skull fracture or intracranial hemorrhage. 2. No cervical spine fracture or subluxation. 3. Mild to moderate chronic right maxillary sinusitis. 4. Minimal reversal of the normal cervical lordosis and minimal levoconvex cervical scoliosis, not previously present. 5. Interval patchy interstitial prominence at the right lung apex and minimally at the left lung apex posteriorly. This could be due to pulmonary contusions. This will be described in more detail on the chest CT report. Electronically Signed   By: Beckie Salts M.D.   On: 07/12/2018 01:25   Ct Chest W Contrast  Result Date: 07/12/2018 CLINICAL DATA:  Blunt chest and abdominal trauma with abdominal pain. EXAM: CT CHEST, ABDOMEN, AND PELVIS WITH CONTRAST TECHNIQUE: Multidetector CT imaging of the chest, abdomen and pelvis was performed following the standard protocol during bolus administration of intravenous contrast. CONTRAST:  ISOVUE-300 IOPAMIDOL (ISOVUE-300) INJECTION 61% COMPARISON:  Portable chest obtained earlier today. Cervical spine CT obtained today. FINDINGS: CT CHEST FINDINGS Cardiovascular: No significant vascular findings. Normal heart size. No pericardial effusion. Mediastinum/Nodes: No enlarged mediastinal, hilar, or axillary lymph nodes. Thyroid gland, trachea, and esophagus demonstrate no significant findings. No mediastinal hemorrhage. Lungs/Pleura: Patchy interstitial prominence  occupying a significant portion of the right upper lobe and small amount of the superior segment of the right lower lobe. Minimal bilateral dependent atelectasis. No pleural fluid or pneumothorax. Musculoskeletal: Normal appearing bones. No fractures. CT ABDOMEN PELVIS FINDINGS Hepatobiliary: No focal liver abnormality is seen. No gallstones, gallbladder wall thickening, or biliary dilatation. Pancreas: Unremarkable. No pancreatic ductal dilatation or surrounding inflammatory changes. Spleen: Normal in size without focal abnormality. Adrenals/Urinary Tract: Small lower pole right renal cyst. Normal appearing left kidney, ureters, urinary bladder and adrenal glands. Stomach/Bowel: Stomach is within normal limits. Appendix appears normal. No evidence of bowel wall thickening, distention, or inflammatory changes. Vascular/Lymphatic: No significant vascular findings are present. No enlarged abdominal or pelvic lymph nodes. Reproductive: Prostate is unremarkable. Other: No abdominal wall hernia or abnormality. No abdominopelvic ascites. Musculoskeletal: Unremarkable bones. No fractures. IMPRESSION: 1. Patchy interstitial prominence occupying a significant portion of the right upper lobe and small amount of the superior segment of the right lower lobe. Given the history of blunt trauma, this is most likely due to pulmonary contusion. Interstitial pneumonitis can also have this appearance. 2. No acute abnormality in the abdomen or pelvis. Electronically Signed   By: Beckie Salts M.D.   On: 07/12/2018 01:33   Ct Cervical Spine Wo Contrast  Result Date: 07/12/2018 CLINICAL DATA:  Head trauma and neck pain following a fall. EXAM: CT HEAD WITHOUT CONTRAST CT CERVICAL SPINE WITHOUT CONTRAST TECHNIQUE: Multidetector CT imaging of the head and cervical spine was performed following the  standard protocol without intravenous contrast. Multiplanar CT image reconstructions of the cervical spine were also generated. COMPARISON:   10/30/2010. FINDINGS: CT HEAD FINDINGS Brain: Normal appearing cerebral hemispheres and posterior fossa structures. Normal size and position of the ventricles. No intracranial hemorrhage, mass lesion or CT evidence of acute infarction. Vascular: No hyperdense vessel or unexpected calcification. Skull: Normal. Negative for fracture or focal lesion. Sinuses/Orbits: Mild to moderate posterior maxillary sinus mucosal thickening on the right. Unremarkable orbits. Other: None. CT CERVICAL SPINE FINDINGS Alignment: Minimal reversal the normal cervical lordosis with mild progression and minimal levoconvex cervical scoliosis, not previously present. No subluxations. Skull base and vertebrae: No acute fracture. No primary bone lesion or focal pathologic process. Soft tissues and spinal canal: No prevertebral fluid or swelling. No visible canal hematoma. Disc levels:  Unremarkable. Upper chest: Interval patchy interstitial prominence at the right lung apex and minimally at the left lung apex posteriorly. Other: Choose 1 IMPRESSION: 1. No skull fracture or intracranial hemorrhage. 2. No cervical spine fracture or subluxation. 3. Mild to moderate chronic right maxillary sinusitis. 4. Minimal reversal of the normal cervical lordosis and minimal levoconvex cervical scoliosis, not previously present. 5. Interval patchy interstitial prominence at the right lung apex and minimally at the left lung apex posteriorly. This could be due to pulmonary contusions. This will be described in more detail on the chest CT report. Electronically Signed   By: Beckie Salts M.D.   On: 07/12/2018 01:25   Mr Brain Wo Contrast  Result Date: 07/12/2018 CLINICAL DATA:  Drug overdose. EXAM: MRI HEAD WITHOUT CONTRAST TECHNIQUE: Multiplanar, multiecho pulse sequences of the brain and surrounding structures were obtained without intravenous contrast. COMPARISON:  CT same day.  MRI 06/02/2010. FINDINGS: Brain: The brain has a normal appearance without  evidence of malformation, atrophy, old or acute small or large vessel infarction, mass lesion, hemorrhage, hydrocephalus or extra-axial collection. Vascular: Major vessels at the base of the brain show flow. Venous sinuses appear patent. Skull and upper cervical spine: Normal. Sinuses/Orbits: Clear/normal. Other: None significant. IMPRESSION: Normal examination. Electronically Signed   By: Paulina Fusi M.D.   On: 07/12/2018 07:28   Ct Abdomen Pelvis W Contrast  Result Date: 07/12/2018 CLINICAL DATA:  Blunt chest and abdominal trauma with abdominal pain. EXAM: CT CHEST, ABDOMEN, AND PELVIS WITH CONTRAST TECHNIQUE: Multidetector CT imaging of the chest, abdomen and pelvis was performed following the standard protocol during bolus administration of intravenous contrast. CONTRAST:  ISOVUE-300 IOPAMIDOL (ISOVUE-300) INJECTION 61% COMPARISON:  Portable chest obtained earlier today. Cervical spine CT obtained today. FINDINGS: CT CHEST FINDINGS Cardiovascular: No significant vascular findings. Normal heart size. No pericardial effusion. Mediastinum/Nodes: No enlarged mediastinal, hilar, or axillary lymph nodes. Thyroid gland, trachea, and esophagus demonstrate no significant findings. No mediastinal hemorrhage. Lungs/Pleura: Patchy interstitial prominence occupying a significant portion of the right upper lobe and small amount of the superior segment of the right lower lobe. Minimal bilateral dependent atelectasis. No pleural fluid or pneumothorax. Musculoskeletal: Normal appearing bones. No fractures. CT ABDOMEN PELVIS FINDINGS Hepatobiliary: No focal liver abnormality is seen. No gallstones, gallbladder wall thickening, or biliary dilatation. Pancreas: Unremarkable. No pancreatic ductal dilatation or surrounding inflammatory changes. Spleen: Normal in size without focal abnormality. Adrenals/Urinary Tract: Small lower pole right renal cyst. Normal appearing left kidney, ureters, urinary bladder and adrenal  glands. Stomach/Bowel: Stomach is within normal limits. Appendix appears normal. No evidence of bowel wall thickening, distention, or inflammatory changes. Vascular/Lymphatic: No significant vascular findings are present. No enlarged abdominal or  pelvic lymph nodes. Reproductive: Prostate is unremarkable. Other: No abdominal wall hernia or abnormality. No abdominopelvic ascites. Musculoskeletal: Unremarkable bones. No fractures. IMPRESSION: 1. Patchy interstitial prominence occupying a significant portion of the right upper lobe and small amount of the superior segment of the right lower lobe. Given the history of blunt trauma, this is most likely due to pulmonary contusion. Interstitial pneumonitis can also have this appearance. 2. No acute abnormality in the abdomen or pelvis. Electronically Signed   By: Beckie Salts M.D.   On: 07/12/2018 01:33   Dg Chest Port 1 View  Result Date: 07/14/2018 CLINICAL DATA:  Shortness of Breath EXAM: PORTABLE CHEST 1 VIEW COMPARISON:  07/12/2018 FINDINGS: Cardiac shadows within normal limits. The lungs are well aerated bilaterally without focal infiltrate or sizable effusion. The faint interstitial changes seen on recent CT are not well appreciated on today's exam. No acute bony abnormality is noted. IMPRESSION: No acute abnormality seen. Electronically Signed   By: Alcide Clever M.D.   On: 07/14/2018 09:04   Dg Chest Port 1 View  Result Date: 07/12/2018 CLINICAL DATA:  Initial evaluation for acute overdose. EXAM: PORTABLE CHEST 1 VIEW COMPARISON:  Prior radiograph from 05/16/2018. FINDINGS: The cardiac and mediastinal silhouettes are stable in size and contour, and remain within normal limits. The lungs are normally inflated. No airspace consolidation, pleural effusion, or pulmonary edema is identified. No findings to suggest aspiration. There is no pneumothorax. No acute osseous abnormality identified. IMPRESSION: No active cardiopulmonary disease. Electronically Signed    By: Rise Mu M.D.   On: 07/12/2018 00:49   US Abdomen Limited Ruq  Result Date: 07/13/2018 CLINICAL DATA:  Abnormal LFTs EXAM: ULTRASOUND ABDOMEN LIMITED RIGHT UPPER QUADRANT COMPARISON:  CT 07/12/2018 FINDINGS: Gallbladder: No gallstones or wall thickening visualized. No sonographic Murphy sign noted by sonographer. Common bile duct: Diameter: Normal caliber, 2 mm Liver: No focal lesion identified. Within normal limits in parenchymal echogenicity. Portal vein is patent on color Doppler imaging with normal direction of blood flow towards the liver. IMPRESSION: Unremarkable right upper quadrant ultrasound. Electronically Signed   By: Charlett Nose M.D.   On: 07/13/2018 09:30    Microbiology: Recent Results (from the past 240 hour(s))  Culture, blood (routine x 2)     Status: None (Preliminary result)   Collection Time: 07/12/18 12:27 AM  Result Value Ref Range Status   Specimen Description   Final    BLOOD LEFT ANTECUBITAL Performed at [facility], [address], [place], [place] [address]    Special Requests   Final    BOTTLES DRAWN AEROBIC AND ANAEROBIC Blood Culture adequate volume Performed at [facility], [address], [place], [place] [address]    Culture   Final    NO GROWTH 4 DAYS Performed at [facility], [address] [place], [place] [address]    Report Status PENDING  Incomplete  Culture, blood (routine x 2)     Status: None (Preliminary result)   Collection Time: 07/12/18 12:27 AM  Result Value Ref Range Status   Specimen Description   Final    BLOOD BLOOD LEFT HAND Performed at [facility], [address] [place], [place] [address]    Special Requests   Final    BOTTLES DRAWN AEROBIC AND ANAEROBIC Blood Culture adequate volume Performed at [facility], [address] [place], [place] [address]    Culture   Final    NO GROWTH 4  DAYS Performed at [facility], [address] [place], [place] [address]    Report Status PENDING  Incomplete  Culture, sputum-assessment     Status: None   Collection Time: 07/12/18  4:20 AM  Result Value Ref Range Status   Specimen Description SPUTUM  Final   Special Requests NONE  Final   Sputum evaluation   Final    Sputum specimen not acceptable for testing.  Please recollect.    NOTIFIED JULIE AT 0555 ON 07/12/18 BY A,MOHAMED Performed at [facility], [address], [place], [place] [address]    Report Status 07/12/2018 FINAL  Final  MRSA PCR Screening     Status: None   Collection Time: 07/12/18  4:20 AM  Result Value Ref Range Status   MRSA by PCR NEGATIVE NEGATIVE Final    Comment:        The GeneXpert MRSA Assay (FDA approved for NASAL specimens only), is one component of a comprehensive MRSA colonization surveillance program. It is not intended to diagnose MRSA infection nor to guide or monitor treatment for MRSA infections. Performed at [facility], [address], [place], [place] [address]   Expectorated sputum assessment w rflx to resp cult     Status: None   Collection Time: 07/12/18  6:46 AM  Result Value Ref Range Status   Specimen Description SPUTUM  Final   Special Requests NONE  Final   Sputum evaluation   Final    THIS SPECIMEN IS ACCEPTABLE FOR SPUTUM CULTURE Performed at [facility], [address] [place], [place] [address]    Report Status 07/12/2018 FINAL  Final  Culture, respiratory     Status: None   Collection Time: 07/12/18  6:46 AM  Result Value Ref Range Status   Specimen Description   Final    SPUTUM Performed at [facility], [address], [place], [place] [address]    Special Requests   Final    NONE Reflexed from [id number] Performed at [facility], [address] [place], [place] [address]    Gram Stain   Final    FEW WBC PRESENT, PREDOMINANTLY  PMN MODERATE GRAM NEGATIVE RODS MODERATE GRAM POSITIVE COCCI    Culture   Final    Consistent with normal respiratory flora. Performed at [facility], [address] [place], [place] [address]    Report Status 07/14/2018 FINAL  Final  Culture, blood (routine x 2) Call MD if unable to obtain prior to antibiotics being given     Status: None (Preliminary result)   Collection Time: 07/12/18  9:18 AM  Result Value Ref Range Status   Specimen Description   Final    BLOOD RIGHT ARM Performed at [facility], [address] [place], [place] [address]    Special Requests   Final    BOTTLES DRAWN AEROBIC AND ANAEROBIC Blood Culture adequate volume Performed at [facility], [address] [place], [place] [address]    Culture   Final    NO GROWTH 4 DAYS Performed at [facility], [address] [place], [place] [address]    Report Status PENDING  Incomplete     Labs: Basic Metabolic Panel: Recent Labs  Lab 07/11/18 2246 07/12/18 0632 07/13/18 0433 07/14/18 0450  NA 142 138 142 142  K 3.9 4.8 3.6 3.8  CL 98 104 110 109  CO2 22 28  27 25  GLUCOSE 209* 98 102* 97  BUN 13 12 10 9   CREATININE 1.53* 0.99 0.64 0.58*  CALCIUM 9.1 7.4* 8.2* 8.5*  MG  --   --  2.2 2.0  PHOS  --   --  1.8* 4.4   Liver Function Tests: Recent Labs  Lab 07/11/18 2246 07/12/18 0632 07/13/18 0433 07/14/18 0450  AST 40 101* 146* 93*  ALT 32 35 55* 51*  ALKPHOS 87 50 47 42  BILITOT 1.0 1.2 0.8 0.5  PROT 8.3* 5.2* 5.4* 5.3*  ALBUMIN 4.9 3.1* 3.1* 3.0*   No results for input(s): LIPASE, AMYLASE in the last 168 hours. No results for input(s): AMMONIA in the last 168 hours. CBC: Recent Labs  Lab 07/11/18 2246 07/12/18 0632 07/13/18 0433 07/14/18 0450  WBC 44.0* 17.4* 14.0* 7.8  NEUTROABS 41.8* 14.7* 10.8* 4.8  HGB 17.9* 13.2 13.5 14.0  HCT 56.3* 41.4 41.7 43.4  MCV 93.7 93.0 91.4 89.3  PLT 332 214 203 224   Cardiac Enzymes: Recent  Labs  Lab 07/11/18 2356 07/12/18 0632 07/12/18 1140 07/12/18 1736 07/12/18 2305 07/13/18 0433 07/14/18 0450  CKTOTAL 174 3,491*  --   --   --  9,147* 2,324*  CKMB  --   --   --   --   --  43.0* 9.1*  TROPONINI  --  0.28* 0.21* 0.23* 0.19*  --   --    BNP: BNP (last 3 results) No results for input(s): BNP in the last 8760 hours.  ProBNP (last 3 results) No results for input(s): PROBNP in the last 8760 hours.  CBG: No results for input(s): GLUCAP in the last 168 hours.     Signed:  Albertine Grates MD, PhD  [facility] 07/16/2018, 8:45 PM
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
## 2018-07-16 NOTE — [facility] Group Notes (Signed)
LCSW Group Therapy Note  07/16/2018 1:15pm  Type of Therapy and Topic:  Group Therapy:  Feelings around Relapse and Recovery  Participation Level:  Active   Description of Group:    Patients in this group will discuss emotions they experience before and after a relapse. They will process how experiencing these feelings, or avoidance of experiencing them, relates to having a relapse. Facilitator will guide patients to explore emotions they have related to recovery. Patients will be encouraged to process which emotions are more powerful. They will be guided to discuss the emotional reaction significant others in their lives may have to their relapse or recovery. Patients will be assisted in exploring ways to respond to the emotions of others without this contributing to a relapse.  Therapeutic Goals: 1. Patient will identify two or more emotions that lead to a relapse for them 2. Patient will identify two emotions that result when they relapse 3. Patient will identify two emotions related to recovery 4. Patient will demonstrate ability to communicate their needs through discussion and/or role plays   Summary of Patient Progress:  Nathan Morton was attentive and engaged during today's processing group. He shared his latest relapse experience and states "I'm not grateful that I survived. I'm just so ashamed of myself." Nathan Morton vented his frustrations regarding [facility] telling him he did not use long enough to meet criteria for admission. He states that he is desperate for residential treatment and would like a referral [facility]. Nathan Morton continues to demonstrate progress in the group setting with improving insight.   Therapeutic Modalities:   Cognitive Behavioral Therapy Solution-Focused Therapy Assertiveness Training Relapse Prevention Therapy   Rona RavensHeather S Adyan Palau, LCSW 07/16/2018 1:47 PM
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
## 2018-07-16 NOTE — Tx Team (Signed)
Initial Treatment Plan 07/16/2018 1:29 PM Max FickleMatthew W Wimes OZH:[id number]    PATIENT STRESSORS: Health problems Marital or family conflict Medication change or noncompliance Substance abuse   PATIENT STRENGTHS: Average or above average intelligence Communication skills Motivation for treatment/growth   PATIENT IDENTIFIED PROBLEMS: Suicidal Ideation  Depression  Overdose   "get on medications that will stop SI"  "[facility]"             DISCHARGE CRITERIA:  Ability to meet basic life and health needs Improved stabilization in mood, thinking, and/or behavior Need for constant or close observation no longer present Verbal commitment to aftercare and medication compliance  PRELIMINARY DISCHARGE PLAN: Attend aftercare/continuing care group Attend PHP/IOP  PATIENT/FAMILY INVOLVEMENT: This treatment plan has been presented to and reviewed with the patient, Max FickleMatthew W Dufault.The patient and family have been given the opportunity to ask questions and make suggestions.  Kennyth LoseLandis, Emalee Knies E, RN 07/16/2018, 1:29 PM
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
## 2018-07-17 DIAGNOSIS — F1120 Opioid dependence, uncomplicated: Secondary | ICD-10-CM

## 2018-07-17 DIAGNOSIS — R45851 Suicidal ideations: Secondary | ICD-10-CM

## 2018-07-17 DIAGNOSIS — F419 Anxiety disorder, unspecified: Secondary | ICD-10-CM

## 2018-07-17 DIAGNOSIS — F332 Major depressive disorder, recurrent severe without psychotic features: Principal | ICD-10-CM | POA: Diagnosis present

## 2018-07-17 LAB — CULTURE, BLOOD (ROUTINE X 2)
CULTURE: NO GROWTH
Culture: NO GROWTH
Culture: NO GROWTH
SPECIAL REQUESTS: ADEQUATE
SPECIAL REQUESTS: ADEQUATE
Special Requests: ADEQUATE

## 2018-07-17 LAB — HEPATIC FUNCTION PANEL
ALT: 54 U/L — ABNORMAL HIGH (ref 0–44)
AST: 41 U/L (ref 15–41)
Albumin: 3.5 g/dL (ref 3.5–5.0)
Alkaline Phosphatase: 46 U/L (ref 38–126)
BILIRUBIN INDIRECT: 0.3 mg/dL (ref 0.3–0.9)
Bilirubin, Direct: 0.1 mg/dL (ref 0.0–0.2)
Total Bilirubin: 0.4 mg/dL (ref 0.3–1.2)
Total Protein: 6.5 g/dL (ref 6.5–8.1)

## 2018-07-17 LAB — URINALYSIS, ROUTINE W REFLEX MICROSCOPIC
Bacteria, UA: NONE SEEN
Bilirubin Urine: NEGATIVE
Glucose, UA: NEGATIVE mg/dL
Ketones, ur: NEGATIVE mg/dL
Leukocytes, UA: NEGATIVE
Nitrite: NEGATIVE
Protein, ur: NEGATIVE mg/dL
Specific Gravity, Urine: 1.008 (ref 1.005–1.030)
pH: 7 (ref 5.0–8.0)

## 2018-07-17 LAB — CK: Total CK: 351 U/L (ref 49–397)

## 2018-07-17 MED ORDER — NALTREXONE HCL 50 MG PO TABS
50.0000 mg | ORAL_TABLET | Freq: Every day | ORAL | Status: DC
  Administered 2018-07-18 – 2018-07-20 (×3): 50 mg via ORAL
  Filled 2018-07-17 (×2): qty 1
  Filled 2018-07-17: qty 14
  Filled 2018-07-17 (×2): qty 1

## 2018-07-17 MED ORDER — VENLAFAXINE HCL ER 37.5 MG PO CP24
37.5000 mg | ORAL_CAPSULE | Freq: Every day | ORAL | Status: DC
  Administered 2018-07-17: 37.5 mg via ORAL
  Filled 2018-07-17 (×3): qty 1

## 2018-07-17 NOTE — Progress Notes (Signed)
Patient has been up in the dayroom watching tv and attended group tonight. He was informed of medications available and scheduled. He had minimal interaction with staff other than requesting his medication. Support given and safety maintained on unit with 15 min checks.

## 2018-07-17 NOTE — Progress Notes (Signed)
[facility]  07/17/2018 9:42 AM Nathan Morton  MRN:  [id number]   Subjective: Patient reports today that he is feeling the same as he did yesterday feeling very depressed and felt like he would be better off dead.  He denies any homicidal ideations and denies any hallucinations.  Patient denies any medication side effects but feels that the medications are not really doing anything for him at this time.  He states that he is wanting to know when he is going to start an antidepressant as antidepressants in the past have greatly if assisted him with his mood.  Patient reports also that he cannot take Seroquel above 75 mg as it makes him too sedated and he has never been able to tolerate 100 mg or anything higher.  Patient detailed a manic episode that lasted for less than 24 hours and then he would crash and denies ever having any type of manic episode it lasted for more than 24 hours, although he states that this was when he was not using drugs he could not determine exactly how long it is been since he had used the drugs before this happened.  He is requesting be restarted on naltrexone and has agreed to restart it tomorrow as today is day 7 since last opiate use.  Patient also states that he does not use the trazodone and does not need it prescribed to him because he will refuse to use it.  He states that he would like to use an antidepressant again and to use something that would give him a little more energy as he feels fatigued.  Patient does report that he has been eating well and with the Seroquel he sleeps very well.  Objective: Patient's chart and findings reviewed and discussed with treatment team.  Patient presents in his room and is pleasant, calm, and cooperative.  Patient is alert and oriented and carrying on logical conversation.  Patient did not attend group this morning.  Patient does interact appropriately with peers and staff though.  Discussed medications and will start a low-dose  of Effexor XR and will discontinue the Depakote at this time.  Principal Problem: MDD (major depressive disorder), recurrent severe, without psychosis (HCC) Diagnosis: Principal Problem:   MDD (major depressive disorder), recurrent severe, without psychosis (HCC) Active Problems:   Bipolar I disorder, most recent episode depressed (HCC)  Total Time spent with patient: 30 minutes  Past Psychiatric History: See H&P  Past Medical History:  Past Medical History:  Diagnosis Date  . Hypertension    History reviewed. No pertinent surgical history. Family History:  Family History  Family history unknown: Yes   Family Psychiatric  History: See H&P Social History:  Social History   Substance and Sexual Activity  Alcohol Use Not Currently     Social History   Substance and Sexual Activity  Drug Use Not Currently    Social History   Socioeconomic History  . Marital status: Single    Spouse name: Not on file  . Number of children: Not on file  . Years of education: Not on file  . Highest education level: Not on file  Occupational History  . Not on file  Social Needs  . Financial resource strain: Not on file  . Food insecurity:    Worry: Not on file    Inability: Not on file  . Transportation needs:    Medical: Not on file    Non-medical: Not on file  Tobacco Use  .  Smoking status: Current Every Day Smoker    Packs/day: 1.00    Types: Cigarettes  . Smokeless tobacco: Never Used  Substance and Sexual Activity  . Alcohol use: Not Currently  . Drug use: Not Currently  . Sexual activity: Not on file  Lifestyle  . Physical activity:    Days per week: Not on file    Minutes per session: Not on file  . Stress: Not on file  Relationships  . Social connections:    Talks on phone: Not on file    Gets together: Not on file    Attends religious service: Not on file    Active member of club or organization: Not on file    Attends meetings of clubs or organizations: Not on  file    Relationship status: Not on file  Other Topics Concern  . Not on file  Social History Narrative  . Not on file   Additional Social History:                         Sleep: Good  Appetite:  Good  Current Medications: Current Facility-Administered Medications  Medication Dose Route Frequency Provider Last Rate Last Dose  . acetaminophen (TYLENOL) tablet 650 mg  650 mg Oral Q6H PRN Elohim Brune, Gerlene Burdock, FNP   650 mg at 07/16/18 2002  . alum & mag hydroxide-simeth (MAALOX/MYLANTA) 200-200-20 MG/5ML suspension 30 mL  30 mL Oral Q4H PRN Rabab Currington, Gerlene Burdock, FNP      . amoxicillin-clavulanate (AUGMENTIN) 875-125 MG per tablet 1 tablet  1 tablet Oral Q12H Reeta Kuk, Gerlene Burdock, FNP   1 tablet at 07/17/18 4098  . hydrOXYzine (ATARAX/VISTARIL) tablet 25 mg  25 mg Oral TID PRN Steadman Prosperi, Gerlene Burdock, FNP      . magnesium hydroxide (MILK OF MAGNESIA) suspension 30 mL  30 mL Oral Daily PRN Jood Retana, Feliz Beam B, FNP      . ondansetron (ZOFRAN) tablet 4 mg  4 mg Oral Q6H PRN Kenyotta Dorfman, Gerlene Burdock, FNP      . QUEtiapine (SEROQUEL) tablet 75 mg  75 mg Oral QHS Eliott Amparan, Gerlene Burdock, FNP   75 mg at 07/16/18 2135  . venlafaxine XR (EFFEXOR-XR) 24 hr capsule 37.5 mg  37.5 mg Oral Daily Mcguire Gasparyan, Gerlene Burdock, [place]        Lab Results:  Results for orders placed or performed during the hospital encounter of 07/16/18 (from the past 48 hour(s))  CK     Status: None   Collection Time: 07/17/18  6:47 AM  Result Value Ref Range   Total CK 351 49 - 397 U/L    Comment: Performed at [facility], [address] [place], [place] [address]  Hepatic function panel     Status: Abnormal   Collection Time: 07/17/18  6:47 AM  Result Value Ref Range   Total Protein 6.5 6.5 - 8.1 g/dL   Albumin 3.5 3.5 - 5.0 g/dL   AST 41 15 - 41 U/L   ALT 54 (H) 0 - 44 U/L   Alkaline Phosphatase 46 38 - 126 U/L   Total Bilirubin 0.4 0.3 - 1.2 mg/dL   Bilirubin, Direct 0.1 0.0 - 0.2 mg/dL   Indirect Bilirubin 0.3 0.3 - 0.9 mg/dL     Comment: Performed at [facility], [address] [place], [place] [address]    Blood Alcohol level:  Lab Results  Component Value Date   ETH <10 07/11/2018  Metabolic Disorder Labs: Lab Results  Component Value Date   HGBA1C 5.3 07/12/2018   MPG 105.41 07/12/2018   MPG 102.54 06/29/2018   Lab Results  Component Value Date   PROLACTIN 21.8 (H) 06/30/2018   PROLACTIN 26.3 (H) 06/29/2018   Lab Results  Component Value Date   CHOL 145 06/29/2018   TRIG 149 06/29/2018   HDL 34 (L) 06/29/2018   CHOLHDL 4.3 06/29/2018   VLDL 30 06/29/2018   LDLCALC 81 06/29/2018    Physical Findings: AIMS:  , ,  ,  ,    CIWA:    COWS:     Musculoskeletal: Strength & Muscle Tone: within normal limits Gait & Station: normal Patient leans: N/A  Psychiatric Specialty Exam: Physical Exam  Nursing note and vitals reviewed. Constitutional: He is oriented to person, place, and time. He appears well-developed and well-nourished.  Cardiovascular: Normal rate.  Respiratory: Effort normal.  Musculoskeletal: Normal range of motion.  Neurological: He is alert and oriented to person, place, and time.  Skin: Skin is warm.    Review of Systems  Constitutional: Negative.   HENT: Negative.   Eyes: Negative.   Respiratory: Negative.   Cardiovascular: Negative.   Gastrointestinal: Negative.   Genitourinary: Negative.   Musculoskeletal: Negative.   Skin: Negative.   Neurological: Negative.   Endo/Heme/Allergies: Negative.   Psychiatric/Behavioral: Positive for depression, substance abuse and suicidal ideas. Negative for hallucinations. The patient does not have insomnia.     Blood pressure 116/74, pulse 91, temperature 98 F (36.7 C), temperature source Oral, resp. rate 18, height 5\' 10"  (1.778 m), weight 88.5 kg, SpO2 100 %.Body mass index is 27.98 kg/m.  General Appearance: Casual  Eye Contact:  Good  Speech:  Clear and Coherent and Normal Rate  Volume:  Normal   Mood:  Depressed  Affect:  Flat  Thought Process:  Coherent and Descriptions of Associations: Intact  Orientation:  Full (Time, Place, and Person)  Thought Content:  WDL  Suicidal Thoughts:  Yes.  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 daily Continue Vistaril 25 mg p.o. 3 times daily as needed for anxiety Continue Seroquel 75 mg p.o. nightly for mood stability Discontinue trazodone Start Effexor XR 37.5 mg p.o. daily for mood stability with intention of increasing to 75 mg tomorrow with tolerability Start naltrexone 50 mg p.o. daily for opiate dependence starting tomorrow Continue Augmentin 875-125 mg every 12 hours Encourage group therapy participation  Maryfrances Bunnell, FNP 07/17/2018, 9:42 AM

## 2018-07-17 NOTE — Progress Notes (Signed)
Adult Psychoeducational Group Note  Date:  07/17/2018 Time:  8:30am-9:30am  Group Topic/Focus:  Goals Group:   The focus of this group is to help patients establish daily goals to achieve during treatment and discuss how the patient can incorporate goal setting into their daily lives to aide in recovery.  Participation Level:  Active  Participation Quality:  Appropriate  Affect:  Appropriate  Cognitive:  Appropriate and Oriented  Insight: Appropriate  Engagement in Group:  Limited  Modes of Intervention:  Discussion  Additional Comments:  Patient informed the group that his goal for the day was to find something grateful to be alive for. Patient informed the group that calling people that can give him reasons to live will be helpful, but patient continued to express feeling like he did not have any reason to live. Patient was receptive of another group member recommending that he highlight small things that he can be grateful about. MHT reminded patient that continuing to tell himself all the reasons he has to live will help to assist him in beginning to believe his reasons.  Annye Asamani Eldred Lievanos 07/17/2018, 1:09 PM

## 2018-07-17 NOTE — [facility] Group Notes (Signed)
[facility] Group Notes:  (Nursing/MHT/Case Management/Adjunct)  Date:  07/17/2018  Time:  1:15 PM  Type of Therapy:  Nurse Education  Participation Level:  Active  Participation Quality:  Appropriate and Attentive  Affect:  Appropriate  Cognitive:  Alert and Appropriate  Insight:  Appropriate  Engagement in Group:  Engaged and Improving  Modes of Intervention:  Discussion and Education  Summary of Progress/Problems: pt's identified needs and learned how to develop healthier coping skills.   Suszanne ConnersMichael R Chike Farrington 07/17/2018, 2:58 PM

## 2018-07-18 MED ORDER — VENLAFAXINE HCL ER 75 MG PO CP24
75.0000 mg | ORAL_CAPSULE | Freq: Every day | ORAL | Status: DC
  Administered 2018-07-18 – 2018-07-19 (×2): 75 mg via ORAL
  Filled 2018-07-18 (×3): qty 1

## 2018-07-18 NOTE — Progress Notes (Signed)
Patient did attend the evening speaker AA meeting.  

## 2018-07-18 NOTE — Plan of Care (Signed)
°  Problem: Education: °Goal: Ability to make informed decisions regarding treatment will improve °Outcome: Progressing °  °Problem: Coping: °Goal: Coping ability will improve °Outcome: Progressing °  °Problem: Health Behavior/Discharge Planning: °Goal: Identification of resources available to assist in meeting health care needs will improve °Outcome: Progressing °  °

## 2018-07-18 NOTE — [facility] Group Notes (Signed)
[facility] Group Notes:  (Nursing)  Date:  07/18/2018  Time: 1:00 pm Type of Therapy:  Nurse Education  Participation Level:  Active  Participation Quality:  Appropriate  Affect:  Appropriate  Cognitive:  Appropriate  Insight:  Appropriate  Engagement in Group:  Engaged  Modes of Intervention:  Education  Summary of Progress/Problems: Nurse-led 'Life Skills' group   Shela NevinValerie S Aleisha Paone 07/18/2018, 2:55 PM

## 2018-07-18 NOTE — Progress Notes (Addendum)
D Pt is observed OOB UAL on the 400 hall today. HE remains quiet  Reluctant to call attention to himself . His speech is soft, he hangs his head down and he frequently returns to  His bed and stays there-despite this writer's encouragement to stay out of the bed. He says " I'd just rather stay in my bed today. My hip is feeling less and less bad..and the intensity - the volume = is slowly wearing  down .     A HE did complete his daily assessment and on this he wrote he  deneidSI today and he rated his depression, hopelessness and anxiety " 5/5/5/", respectively.      R Safety is in place.

## 2018-07-18 NOTE — Tx Team (Addendum)
Interdisciplinary Treatment and Diagnostic Plan Update  07/19/2018 Time of Session: [id number] Nathan Morton MRN: [id number]  Principal Diagnosis: MDD (major depressive disorder), recurrent severe, without psychosis (HCC)  Secondary Diagnoses: Principal Problem:   MDD (major depressive disorder), recurrent severe, without psychosis (HCC) Active Problems:   Bipolar I disorder, most recent episode depressed (HCC)   Current Medications:  Current Facility-Administered Medications  Medication Dose Route Frequency Provider Last Rate Last Dose  . acetaminophen (TYLENOL) tablet 650 mg  650 mg Oral Q6H PRN Money, Gerlene Burdock, FNP   650 mg at 07/16/18 2002  . alum & mag hydroxide-simeth (MAALOX/MYLANTA) 200-200-20 MG/5ML suspension 30 mL  30 mL Oral Q4H PRN Money, Gerlene Burdock, FNP      . hydrOXYzine (ATARAX/VISTARIL) tablet 25 mg  25 mg Oral TID PRN Money, Gerlene Burdock, FNP   25 mg at 07/17/18 1357  . magnesium hydroxide (MILK OF MAGNESIA) suspension 30 mL  30 mL Oral Daily PRN Money, Feliz Beam B, FNP      . naltrexone (DEPADE) tablet 50 mg  50 mg Oral Daily Money, Travis B, FNP   50 mg at 07/19/18 0827  . ondansetron (ZOFRAN) tablet 4 mg  4 mg Oral Q6H PRN Money, Gerlene Burdock, FNP      . QUEtiapine (SEROQUEL) tablet 75 mg  75 mg Oral QHS Money, Gerlene Burdock, FNP   75 mg at 07/18/18 2106  . venlafaxine XR (EFFEXOR-XR) 24 hr capsule 75 mg  75 mg Oral Daily Money, Gerlene Burdock, FNP   75 mg at 07/19/18 0827   PTA Medications: Medications Prior to Admission  Medication Sig Dispense Refill Last Dose  . hydrOXYzine (ATARAX/VISTARIL) 50 MG tablet Take 1 tablet (50 mg total) by mouth every 6 (six) hours as needed for anxiety. 30 tablet 0 Past Week at Unknown time  . naltrexone (DEPADE) 50 MG tablet Take 1 tablet (50 mg total) by mouth daily. 30 tablet 0 Past Week at Unknown time  . QUEtiapine (SEROQUEL) 100 MG tablet Take 1.5 tablets (150 mg total) by mouth at bedtime. 45 tablet 0 Past Week at Unknown time    Patient Stressors:  Health problems Marital or family conflict Medication change or noncompliance Substance abuse  Patient Strengths: Average or above average intelligence Communication skills Motivation for treatment/growth  Treatment Modalities: Medication Management, Group therapy, Case management,  1 to 1 session with clinician, Psychoeducation, Recreational therapy.   Physician Treatment Plan for Primary Diagnosis: MDD (major depressive disorder), recurrent severe, without psychosis (HCC) Long Term Goal(s): Improvement in symptoms so as ready for discharge Improvement in symptoms so as ready for discharge   Short Term Goals: Ability to identify changes in lifestyle to reduce recurrence of condition will improve Ability to maintain clinical measurements within normal limits will improve Ability to identify changes in lifestyle to reduce recurrence of condition will improve Ability to identify triggers associated with substance abuse/mental health issues will improve  Medication Management: Evaluate patient's response, side effects, and tolerance of medication regimen.  Therapeutic Interventions: 1 to 1 sessions, Unit Group sessions and Medication administration.  Evaluation of Outcomes: Progressing  Physician Treatment Plan for Secondary Diagnosis: Principal Problem:   MDD (major depressive disorder), recurrent severe, without psychosis (HCC) Active Problems:   Bipolar I disorder, most recent episode depressed (HCC)  Long Term Goal(s): Improvement in symptoms so as ready for discharge Improvement in symptoms so as ready for discharge   Short Term Goals: Ability to identify changes in lifestyle to reduce recurrence of condition will  improve Ability to maintain clinical measurements within normal limits will improve Ability to identify changes in lifestyle to reduce recurrence of condition will improve Ability to identify triggers associated with substance abuse/mental health issues will improve      Medication Management: Evaluate patient's response, side effects, and tolerance of medication regimen.  Therapeutic Interventions: 1 to 1 sessions, Unit Group sessions and Medication administration.  Evaluation of Outcomes: Progressing   RN Treatment Plan for Primary Diagnosis: MDD (major depressive disorder), recurrent severe, without psychosis (HCC) Long Term Goal(s): Knowledge of disease and therapeutic regimen to maintain health will improve  Short Term Goals: Ability to remain free from injury will improve, Ability to verbalize frustration and anger appropriately will improve, Ability to disclose and discuss suicidal ideas and Ability to identify and develop effective coping behaviors will improve  Medication Management: RN will administer medications as ordered by provider, will assess and evaluate patient's response and provide education to patient for prescribed medication. RN will report any adverse and/or side effects to prescribing provider.  Therapeutic Interventions: 1 on 1 counseling sessions, Psychoeducation, Medication administration, Evaluate responses to treatment, Monitor vital signs and CBGs as ordered, Perform/monitor CIWA, COWS, AIMS and Fall Risk screenings as ordered, Perform wound care treatments as ordered.  Evaluation of Outcomes: Progressing   LCSW Treatment Plan for Primary Diagnosis: MDD (major depressive disorder), recurrent severe, without psychosis (HCC) Long Term Goal(s): Safe transition to appropriate next level of care at discharge, Engage patient in therapeutic group addressing interpersonal concerns.  Short Term Goals: Engage patient in aftercare planning with referrals and resources, Facilitate patient progression through stages of change regarding substance use diagnoses and concerns and Identify triggers associated with mental health/substance abuse issues  Therapeutic Interventions: Assess for all discharge needs, 1 to 1 time with Social worker,  Explore available resources and support systems, Assess for adequacy in community support network, Educate family and significant other(s) on suicide prevention, Complete Psychosocial Assessment, Interpersonal group therapy.  Evaluation of Outcomes: Progressing   Progress in Treatment: Attending groups: Yes. Participating in groups: Yes. Taking medication as prescribed: Yes. Toleration medication: Yes. Family/Significant other contact made: No, will contact:  pt's mother with pt consent Patient understands diagnosis: Yes. Discussing patient identified problems/goals with staff: Yes. Medical problems stabilized or resolved: Yes. Denies suicidal/homicidal ideation: Yes. Issues/concerns per patient self-inventory: No. Other: n/a   New problem(s) identified: No, Describe:  n/a  New Short Term/Long Term Goal(s): detox, medication management for mood stabilization; elimination of SI thoughts; development of comprehensive mental wellness/sobriety plan.   Patient Goals:  "[facility]  Discharge Plan or Barriers: CSW assessing for appropriate referrals. Pt is hoping to get screening and [facility]. [facility] pamphlet, Mobile Crisis information, and AA/NA information provided to patient for additional community support and resources.   Reason for Continuation of Hospitalization: Anxiety Depression Medication stabilization Withdrawal symptoms  Estimated Length of Stay: Wed, 07/21/18  Attendees: Patient: Orpah ClintonMatthew Morton  07/19/2018 9:24 AM  Physician: Dr. Jeannine KittenFarah MD 07/19/2018 9:24 AM  Nursing: Lanora ManisElizabeth RN 07/19/2018 9:24 AM  RN Care Manager:x 07/19/2018 9:24 AM  Social Worker: Corrie MckusickHeather Mychael Soots LCSW 07/19/2018 9:24 AM  Recreational Therapist: x 07/19/2018 9:24 AM  Other: Hillery Jacksanika Lewis NP 07/19/2018 9:24 AM  Other:  07/19/2018 9:24 AM  Other: 07/19/2018 9:24 AM    Scribe for Treatment Team: Rona RavensHeather S  Graison Leinberger, LCSW 07/19/2018 9:24 AM

## 2018-07-18 NOTE — Progress Notes (Signed)
Writer observed patient up in the dayroom briefly before requesting his medications for the night and returned to his room to lie down. He received a hot and cold pack for his hip. He reports that [facility]. Support given and safety maintained on unit with 15 min checks.

## 2018-07-18 NOTE — Progress Notes (Signed)
[facility]  07/18/2018 10:28 AM Nathan FickleMatthew W Morton  MRN:  [id number]   Subjective: Patient states today that he is feeling much better.  He denies any type of manic symptoms today.  He reports sleeping very well and having a good appetite.  He denies any medication side effects from using the Effexor.  Patient denies any suicidal or homicidal ideations and denies any hallucinations.  Patient reports that his depression has decreased but there is still some depression there.  He is stating that he would like to discharge and [facility] and has been talking to social work about it as well.  Objective: Patient's chart and findings reviewed and discussed with treatment team.  Patient presents in his room and is lying down but is awake.  Patient is pleasant, calm, and cooperative.  Patient is future oriented with his treatment and is been interacting with peers and staff appropriately.  Principal Problem: MDD (major depressive disorder), recurrent severe, without psychosis (HCC) Diagnosis: Principal Problem:   MDD (major depressive disorder), recurrent severe, without psychosis (HCC) Active Problems:   Bipolar I disorder, most recent episode depressed (HCC)  Total Time spent with patient: 30 minutes  Past Psychiatric History: See H&P  Past Medical History:  Past Medical History:  Diagnosis Date  . Hypertension    History reviewed. No pertinent surgical history. Family History:  Family History  Family history unknown: Yes   Family Psychiatric  History: See H&P Social History:  Social History   Substance and Sexual Activity  Alcohol Use Not Currently     Social History   Substance and Sexual Activity  Drug Use Not Currently    Social History   Socioeconomic History  . Marital status: Single    Spouse name: Not on file  . Number of children: Not on file  . Years of education: Not on file  . Highest education level: Not on file  Occupational History  . Not  on file  Social Needs  . Financial resource strain: Not on file  . Food insecurity:    Worry: Not on file    Inability: Not on file  . Transportation needs:    Medical: Not on file    Non-medical: Not on file  Tobacco Use  . Smoking status: Current Every Day Smoker    Packs/day: 1.00    Types: Cigarettes  . Smokeless tobacco: Never Used  Substance and Sexual Activity  . Alcohol use: Not Currently  . Drug use: Not Currently  . Sexual activity: Not on file  Lifestyle  . Physical activity:    Days per week: Not on file    Minutes per session: Not on file  . Stress: Not on file  Relationships  . Social connections:    Talks on phone: Not on file    Gets together: Not on file    Attends religious service: Not on file    Active member of club or organization: Not on file    Attends meetings of clubs or organizations: Not on file    Relationship status: Not on file  Other Topics Concern  . Not on file  Social History Narrative  . Not on file   Additional Social History:                         Sleep: Good  Appetite:  Good  Current Medications: Current Facility-Administered Medications  Medication Dose Route Frequency Provider Last Rate  Last Dose  . acetaminophen (TYLENOL) tablet 650 mg  650 mg Oral Q6H PRN Tahjai Schetter, Gerlene Burdock, FNP   650 mg at 07/16/18 2002  . alum & mag hydroxide-simeth (MAALOX/MYLANTA) 200-200-20 MG/5ML suspension 30 mL  30 mL Oral Q4H PRN Galvin Aversa, Gerlene Burdock, FNP      . amoxicillin-clavulanate (AUGMENTIN) 875-125 MG per tablet 1 tablet  1 tablet Oral Q12H Jazmine Heckman, Gerlene Burdock, FNP   1 tablet at 07/18/18 0853  . hydrOXYzine (ATARAX/VISTARIL) tablet 25 mg  25 mg Oral TID PRN Fatiha Guzy, Gerlene Burdock, FNP   25 mg at 07/17/18 1357  . magnesium hydroxide (MILK OF MAGNESIA) suspension 30 mL  30 mL Oral Daily PRN Steve Youngberg, Feliz Beam B, FNP      . naltrexone (DEPADE) tablet 50 mg  50 mg Oral Daily Jazman Reuter B, FNP   50 mg at 07/18/18 0853  . ondansetron (ZOFRAN) tablet 4 mg   4 mg Oral Q6H PRN Luda Charbonneau, Gerlene Burdock, FNP      . QUEtiapine (SEROQUEL) tablet 75 mg  75 mg Oral QHS Kenston Longton, Gerlene Burdock, FNP   75 mg at 07/17/18 2110  . venlafaxine XR (EFFEXOR-XR) 24 hr capsule 75 mg  75 mg Oral Daily Moataz Tavis, Gerlene Burdock, FNP   75 mg at 07/18/18 1017    Lab Results:  Results for orders placed or performed during the hospital encounter of 07/16/18 (from the past 48 hour(s))  CK     Status: None   Collection Time: 07/17/18  6:47 AM  Result Value Ref Range   Total CK 351 49 - 397 U/L    Comment: Performed at [facility], [address], [place], [place] [address]  Hepatic function panel     Status: Abnormal   Collection Time: 07/17/18  6:47 AM  Result Value Ref Range   Total Protein 6.5 6.5 - 8.1 g/dL   Albumin 3.5 3.5 - 5.0 g/dL   AST 41 15 - 41 U/L   ALT 54 (H) 0 - 44 U/L   Alkaline Phosphatase 46 38 - 126 U/L   Total Bilirubin 0.4 0.3 - 1.2 mg/dL   Bilirubin, Direct 0.1 0.0 - 0.2 mg/dL   Indirect Bilirubin 0.3 0.3 - 0.9 mg/dL    Comment: Performed at [facility], [address], [place], [place] [address]  Urinalysis, Routine w reflex microscopic     Status: Abnormal   Collection Time: 07/17/18 10:41 AM  Result Value Ref Range   Color, Urine STRAW (A) YELLOW   APPearance CLEAR CLEAR   Specific Gravity, Urine 1.008 1.005 - 1.030   pH 7.0 5.0 - 8.0   Glucose, UA NEGATIVE NEGATIVE mg/dL   Hgb urine dipstick LARGE (A) NEGATIVE   Bilirubin Urine NEGATIVE NEGATIVE   Ketones, ur NEGATIVE NEGATIVE mg/dL   Protein, ur NEGATIVE NEGATIVE mg/dL   Nitrite NEGATIVE NEGATIVE   Leukocytes, UA NEGATIVE NEGATIVE   RBC / HPF 6-10 0 - 5 RBC/hpf   WBC, UA 0-5 0 - 5 WBC/hpf   Bacteria, UA NONE SEEN NONE SEEN    Comment: Performed at [facility], [address], [place], [place] [address]    Blood Alcohol level:  Lab Results  Component Value Date   [facility] <10 07/11/2018    Metabolic Disorder Labs: Lab Results  Component  Value Date   HGBA1C 5.3 07/12/2018   MPG 105.41 07/12/2018   MPG 102.54 06/29/2018   Lab Results  Component Value Date   PROLACTIN 21.8 (  H) 06/30/2018   PROLACTIN 26.3 (H) 06/29/2018   Lab Results  Component Value Date   CHOL 145 06/29/2018   TRIG 149 06/29/2018   HDL 34 (L) 06/29/2018   CHOLHDL 4.3 06/29/2018   VLDL 30 06/29/2018   LDLCALC 81 06/29/2018    Physical Findings: AIMS:  , ,  ,  ,    CIWA:    COWS:     Musculoskeletal: Strength & Muscle Tone: within normal limits Gait & Station: normal Patient leans: N/A  Psychiatric Specialty Exam: Physical Exam  Nursing note and vitals reviewed. Constitutional: He is oriented to person, place, and time. He appears well-developed and well-nourished.  Cardiovascular: Normal rate.  Respiratory: Effort normal.  Musculoskeletal: Normal range of motion.  Neurological: He is alert and oriented to person, place, and time.  Skin: Skin is warm.    Review of Systems  Constitutional: Negative.   HENT: Negative.   Eyes: Negative.   Respiratory: Negative.   Cardiovascular: Negative.   Gastrointestinal: Negative.   Genitourinary: Negative.   Musculoskeletal: Negative.   Skin: Negative.   Neurological: Negative.   Endo/Heme/Allergies: Negative.   Psychiatric/Behavioral: Positive for depression and substance abuse. Negative for hallucinations and suicidal ideas. The patient does not have insomnia.     Blood pressure 113/68, pulse (!) 103, temperature 98.2 F (36.8 C), temperature source Oral, resp. rate 18, height 5\' 10"  (1.778 m), weight 88.5 kg, SpO2 100 %.Body mass index is 27.98 kg/m.  General Appearance: Casual  Eye Contact:  Good  Speech:  Clear and Coherent and Normal Rate  Volume:  Normal  Mood:  Depressed  Affect:  Congruent  Thought Process:  Coherent and Descriptions of Associations: Intact  Orientation:  Full (Time, Place, and Person)  Thought Content:  WDL  Suicidal Thoughts:  No  Homicidal Thoughts:  No   Memory:  Immediate;   Good Recent;   Good Remote;   Fair  Judgement:  Fair  Insight:  Fair  Psychomotor Activity:  Normal  Concentration:  Concentration: Good and Attention Span: Good  Recall:  Good  Fund of Knowledge:  Good  Language:  Good  Akathisia:  No  Handed:  Right  AIMS (if indicated):     Assets:  Communication Skills Desire for Improvement Physical Health Resilience Transportation  ADL's:  Intact  Cognition:  WNL  Sleep:  Number of Hours: 6   Problems addressed Major depressive disorder recurrent severe without psychosis R/O bipolar 1 disorder most recent episode depressed  Treatment Plan Summary: Daily contact with patient to assess and evaluate symptoms and progress in treatment, Medication management and Plan is to: Continue Vistaril 25 mg p.o. 3 times daily as needed for anxiety Continue Seroquel 75 mg p.o. nightly for mood stability Increase Effexor XR 75 mg p.o. daily for mood stability  Start naltrexone 50 mg p.o. daily for opiate dependence Continue Augmentin 875-125 mg every 12 hours Encourage group therapy participation  Maryfrances Bunnell, FNP 07/18/2018, 10:28 AM

## 2018-07-18 NOTE — Progress Notes (Signed)
Patient has been up in the dayroom watching tv, he came to request heat and cold packs for his left hip side which he reports hurts all the time. He seemed a little more pleasant tonight. He reported that he is working on his mood. Support given and safety maintained on unit with 15 min checks.

## 2018-07-19 MED ORDER — QUETIAPINE FUMARATE 100 MG PO TABS
100.0000 mg | ORAL_TABLET | Freq: Once | ORAL | Status: AC
  Administered 2018-07-19: 100 mg via ORAL
  Filled 2018-07-19 (×2): qty 1

## 2018-07-19 MED ORDER — QUETIAPINE FUMARATE 100 MG PO TABS: 100.0000 mg | ORAL_TABLET | Freq: Every day | ORAL | Status: DC

## 2018-07-19 NOTE — Progress Notes (Signed)
DAR NOTE: Patient presents with flat affect and depressed mood.  Denies suicidal thoughts, pain, auditory and visual hallucinations.  Described energy level as low and concentration as poor.  Rates depression at 8, hopelessness at 5, and anxiety at 5. Patient is withdrawn and isolative.  Minimal interaction with staff and peers.   Maintained on routine safety checks.  Medications given as prescribed.  Support and encouragement offered as needed.  Attended group and participated.  Offered no complaint. Patient is safe on the unit.

## 2018-07-19 NOTE — Progress Notes (Signed)
[facility] MD Progress Note  07/19/2018 12:53 PM Nathan Morton  MRN:  [id number]   Evaluation: Nathan Morton observed sitting in day room.  Patient presents with a blank stare and dilated pupils.  Patient was asked about medication management and substance use. Nathan Morton reports Effexor has him feeling "wired".  Discussed discontinuing Effexor and initiating Wellbutrin however patient reports he used to abuse Wellbutrin while in prison.  Continues to to report passive suicidal ideations. Patient presents flat and guarded. " I am still not happy that I am alive" denies suicidal ideations during this assessment.  Denies plan or intent.  Staff notified to monitor cheeking her medications and consider room search for illicit drugs.  Rates his depression 8 out of 10 with 10 being the worst.  Reports a good appetite.  States he is resting well.  Patient reports he was admitted due to heroin intentional overdose.  Reports he is hopeful to attend a residential treatment facility after discharge.  Attending psychiatrist to follow-up with patient regarding medication management.  Support, encouragement and  reassurance was provided.   History: Per H&P assessment notes: 27 year old male,known to our unit from recent psychiatric admission from 11/26 through 11/29. At the time was admitted for depression, suicidal ideations. He was diagnosed with Bipolar Disorder, Substance Abuse ( at the time was sober) and was discharged on Seroquel and Naltrexone.  He presented to the hospital on 12/8 , reporting depression , relapse on opiates, and suicidal attempt by overdosing . He reports he had recently relapsed after  period of about two months of sobriety. States he has little memory of event, but thinks he called a friend who brought him to the hospital. Patient was briefly admitted to medical unit for AKI and Rhabdomyolisis. Work up also revealed patchy interstitial finding on RUL, felt to be pulmonary contusion or pneumonia Describes  recent stressors include GF telling him she was planning on moving out of state ,socializing with someone who was actively using drugs, being told that he did not qualify to get into a rehab. Patient reports he has been depressed recently " even when things were OK". States he was having difficulty finding an appropriate dose of Seroquel, which he felt was causing excessive sedation at prescribed dose.   Principal Problem: MDD (major depressive disorder), recurrent severe, without psychosis (HCC) Diagnosis: Principal Problem:   MDD (major depressive disorder), recurrent severe, without psychosis (HCC) Active Problems:   Bipolar I disorder, most recent episode depressed (HCC)  Total Time spent with patient: 30 minutes  Past Psychiatric History: See H&P  Past Medical History:  Past Medical History:  Diagnosis Date  . Hypertension    History reviewed. No pertinent surgical history. Family History:  Family History  Family history unknown: Yes   Family Psychiatric  History: See H&P Social History:  Social History   Substance and Sexual Activity  Alcohol Use Not Currently     Social History   Substance and Sexual Activity  Drug Use Not Currently    Social History   Socioeconomic History  . Marital status: Single    Spouse name: Not on file  . Number of children: Not on file  . Years of education: Not on file  . Highest education level: Not on file  Occupational History  . Not on file  Social Needs  . Financial resource strain: Not on file  . Food insecurity:    Worry: Not on file    Inability: Not on file  .  Transportation needs:    Medical: Not on file    Non-medical: Not on file  Tobacco Use  . Smoking status: Current Every Day Smoker    Packs/day: 1.00    Types: Cigarettes  . Smokeless tobacco: Never Used  Substance and Sexual Activity  . Alcohol use: Not Currently  . Drug use: Not Currently  . Sexual activity: Not on file  Lifestyle  . Physical activity:     Days per week: Not on file    Minutes per session: Not on file  . Stress: Not on file  Relationships  . Social connections:    Talks on phone: Not on file    Gets together: Not on file    Attends religious service: Not on file    Active member of club or organization: Not on file    Attends meetings of clubs or organizations: Not on file    Relationship status: Not on file  Other Topics Concern  . Not on file  Social History Narrative  . Not on file   Additional Social History:                         Sleep: Good  Appetite:  Good  Current Medications: Current Facility-Administered Medications  Medication Dose Route Frequency Provider Last Rate Last Dose  . acetaminophen (TYLENOL) tablet 650 mg  650 mg Oral Q6H PRN Money, Gerlene Burdockravis B, FNP   650 mg at 07/16/18 2002  . alum & mag hydroxide-simeth (MAALOX/MYLANTA) 200-200-20 MG/5ML suspension 30 mL  30 mL Oral Q4H PRN Money, Gerlene Burdockravis B, FNP      . hydrOXYzine (ATARAX/VISTARIL) tablet 25 mg  25 mg Oral TID PRN Money, Gerlene Burdockravis B, FNP   25 mg at 07/17/18 1357  . magnesium hydroxide (MILK OF MAGNESIA) suspension 30 mL  30 mL Oral Daily PRN Money, Feliz Beamravis B, FNP      . naltrexone (DEPADE) tablet 50 mg  50 mg Oral Daily Money, Travis B, FNP   50 mg at 07/19/18 0827  . ondansetron (ZOFRAN) tablet 4 mg  4 mg Oral Q6H PRN Money, Gerlene Burdockravis B, FNP      . QUEtiapine (SEROQUEL) tablet 75 mg  75 mg Oral QHS Money, Gerlene Burdockravis B, FNP   75 mg at 07/18/18 2106  . venlafaxine XR (EFFEXOR-XR) 24 hr capsule 75 mg  75 mg Oral Daily Money, Gerlene Burdockravis B, FNP   75 mg at 07/19/18 [id number]    Lab Results:  No results found for this or any previous visit (from the past 48 hour(s)).  Blood Alcohol level:  Lab Results  Component Value Date   ETH <10 07/11/2018    Metabolic Disorder Labs: Lab Results  Component Value Date   HGBA1C 5.3 07/12/2018   MPG 105.41 07/12/2018   MPG 102.54 06/29/2018   Lab Results  Component Value Date   PROLACTIN 21.8 (H)  06/30/2018   PROLACTIN 26.3 (H) 06/29/2018   Lab Results  Component Value Date   CHOL 145 06/29/2018   TRIG 149 06/29/2018   HDL 34 (L) 06/29/2018   CHOLHDL 4.3 06/29/2018   VLDL 30 06/29/2018   LDLCALC 81 06/29/2018    Physical Findings: AIMS: Facial and Oral Movements Muscles of Facial Expression: None, normal Lips and Perioral Area: None, normal Jaw: None, normal Tongue: None, normal,Extremity Movements Upper (arms, wrists, hands, fingers): None, normal Lower (legs, knees, ankles, toes): None, normal, Trunk Movements Neck, shoulders, hips: None, normal, Overall Severity Severity of abnormal  movements (highest score from questions above): None, normal Incapacitation due to abnormal movements: None, normal Patient's awareness of abnormal movements (rate only patient's report): No Awareness, Dental Status Current problems with teeth and/or dentures?: No Does patient usually wear dentures?: No  CIWA:    COWS:     Musculoskeletal: Strength & Muscle Tone: within normal limits Gait & Station: normal Patient leans: N/A  Psychiatric Specialty Exam: Physical Exam  Nursing note and vitals reviewed. Constitutional: He is oriented to person, place, and time. He appears well-developed and well-nourished.  Cardiovascular: Normal rate.  Respiratory: Effort normal.  Musculoskeletal: Normal range of motion.  Neurological: He is alert and oriented to person, place, and time.  Skin: Skin is warm.    Review of Systems  HENT:       Multiple facial tattoos noted  Neurological: Negative.   Psychiatric/Behavioral: Positive for depression and substance abuse. Negative for hallucinations and suicidal ideas. The patient does not have insomnia.   All other systems reviewed and are negative.   Blood pressure 117/79, pulse (!) 104, temperature 98.1 F (36.7 C), temperature source Oral, resp. rate 18, height 5\' 10"  (1.778 m), weight 88.5 kg, SpO2 100 %.Body mass index is 27.98 kg/m.   General Appearance: Casual  Eye Contact:  Good  Speech:  Clear and Coherent and Normal Rate  Volume:  Normal  Mood:  Depressed  Affect:  Congruent  Thought Process:  Coherent and Descriptions of Associations: Intact  Orientation:  Full (Time, Place, and Person)  Thought Content:  WDL  Suicidal Thoughts:  No  Homicidal Thoughts:  No  Memory:  Immediate;   Good Recent;   Good Remote;   Fair  Judgement:  Fair  Insight:  Fair  Psychomotor Activity:  Normal  Concentration:  Concentration: Good and Attention Span: Good  Recall:  Good  Fund of Knowledge:  Good  Language:  Good  Akathisia:  No  Handed:  Right  AIMS (if indicated):     Assets:  Communication Skills Desire for Improvement Physical Health Resilience Transportation  ADL's:  Intact  Cognition:  WNL  Sleep:  Number of Hours: 4.75      Treatment Plan Summary: Daily contact with patient to assess and evaluate symptoms and progress in treatment and Medication management   Continue with current treatment plan on 07/19/2018 as listed below except where noted   Major depressive disorder recurrent severe without psychosis R/O bipolar 1 disorder most recent episode depressed  Continue Vistaril 25 mg p.o. 3 times daily as needed for anxiety Continue Seroquel 75 mg p.o. nightly for mood stability  Discontinue Effexor XR 75 mg p.o. daily for mood stability -( patient reported side effect)  Continue naltrexone 50 mg p.o. daily for opiate dependence Continue Augmentin 875-125 mg every 12 hours   Encourage group therapy participation Csw to continue working on discharge disposition   Oneta Rack, NP 07/19/2018, 12:53 PM

## 2018-07-19 NOTE — Plan of Care (Addendum)
Patient evaluated due to dilated pupils he is blaming venlafaxine for this He is alert and oriented to person place time situation does not appear impaired on exam not sluggish but does not want to stop naltrexone  At this point will discontinue venlafaxine and asked to discontinue quetiapine because if psychotropic agents do dilate pupils that may be an indicator of glaucoma-we will further monitor   no evidence of acute impairment-

## 2018-07-19 NOTE — [facility] Suicide Risk Assessment (Signed)
[facility] INPATIENT:  Family/Significant Other Suicide Prevention Education  Suicide Prevention Education:  Education Completed; Carlynn PurlDana Carek (pt's mother) [phone] has been identified by the patient as the family member/significant other with whom the patient will be residing, and identified as the person(s) who will aid the patient in the event of a mental health crisis (suicidal ideations/suicide attempt).  With written consent from the patient, the family member/significant other has been provided the following suicide prevention education, prior to the and/or following the discharge of the patient.  The suicide prevention education provided includes the following:  Suicide risk factors  Suicide prevention and interventions  National Suicide Hotline telephone number  [facility] assessment telephone number  [facility] Emergency Assistance 911  [facility] and/or Residential [facility] Crisis [facility] telephone number  Request made of family/significant other to:  Remove weapons (e.g., guns, rifles, knives), all items previously/currently identified as safety concern.    Remove drugs/medications (over-the-counter, prescriptions, illicit drugs), all items previously/currently identified as a safety concern.  The family member/significant other verbalizes understanding of the suicide prevention education information provided.  The family member/significant other agrees to remove the items of safety concern listed above.  Rona RavensHeather S Maycee Blasco LCSW 07/19/2018, 10:24 AM

## 2018-07-19 NOTE — Progress Notes (Signed)
Adult Psychoeducational Group Note  Date:  07/19/2018 Time:  10:25 PM  Group Topic/Focus:  Wrap-Up Group:   The focus of this group is to help patients review their daily goal of treatment and discuss progress on daily workbooks.  Participation Level:  Active  Participation Quality:  Appropriate  Affect:  Appropriate  Cognitive:  Appropriate  Insight: Appropriate  Engagement in Group:  Engaged  Modes of Intervention:  Discussion  Additional Comments:  Pt stated goal was to find something to be grateful for while he is alive.  Pt stated he is grateful for his family and the people who have stuck in there with him.  Pt rated the day at a 7/10.  Jaslene Marsteller 07/19/2018, 10:25 PM

## 2018-07-19 NOTE — Progress Notes (Signed)
Recreation Therapy Notes  Date: 12.16.19 Time: 0930 Location: [address] Dayroom  Group Topic: Stress Management  Goal Area(s) Addresses:  Patient will verbalize importance of using healthy stress management.  Patient will identify positive emotions associated with healthy stress management.   Intervention: Stress Management  Activity :  Meditation.  LRT introduced the stress management technique of meditation.  LRT played a meditation that focused on getting your mind prepared to face the day.  Patients were to listen and follow along as meditation played.  Education:  Stress Management, Discharge Planning.   Education Outcome: Acknowledges edcuation/In group clarification offered/Needs additional education  Clinical Observations/Feedback: Pt did not attend group.     Caroll RancherMarjette Dellia Donnelly, LRT/CTRS         Caroll RancherLindsay, Nimsi Males A 07/19/2018 11:31 AM

## 2018-07-20 MED ORDER — BUPROPION HCL ER (XL) 150 MG PO TB24
150.0000 mg | ORAL_TABLET | Freq: Every day | ORAL | Status: DC
  Filled 2018-07-20: qty 14
  Filled 2018-07-20: qty 1
  Filled 2018-07-20: qty 14
  Filled 2018-07-20: qty 1

## 2018-07-20 MED ORDER — HYDROXYZINE HCL 25 MG PO TABS: 25.0000 mg | ORAL_TABLET | Freq: Three times a day (TID) | ORAL | 0 refills | Status: DC | PRN

## 2018-07-20 MED ORDER — NALTREXONE HCL 50 MG PO TABS: 50.0000 mg | ORAL_TABLET | Freq: Every day | ORAL | 0 refills | Status: DC

## 2018-07-20 MED ORDER — TRAZODONE HCL 50 MG PO TABS
50.0000 mg | ORAL_TABLET | Freq: Every evening | ORAL | Status: DC | PRN
  Administered 2018-07-20 (×2): 50 mg via ORAL
  Filled 2018-07-20 (×6): qty 1

## 2018-07-20 MED ORDER — BUPROPION HCL ER (XL) 150 MG PO TB24: 150.0000 mg | ORAL_TABLET | Freq: Every day | ORAL | 0 refills | Status: DC

## 2018-07-20 MED ORDER — QUETIAPINE FUMARATE 50 MG PO TABS: 50.0000 mg | ORAL_TABLET | Freq: Three times a day (TID) | ORAL | Status: DC

## 2018-07-20 NOTE — BHH Suicide Risk Assessment (Signed)
[facility]   Principal Problem: MDD (major depressive disorder), recurrent severe, without psychosis (HCC) Discharge Diagnoses: Principal Problem:   MDD (major depressive disorder), recurrent severe, without psychosis (HCC) Active Problems:   Bipolar I disorder, most recent episode depressed (HCC)   Total Time spent with patient: 20 minutes  Musculoskeletal: Strength & Muscle Tone: within normal limits Gait & Station: normal Patient leans: N/A  Psychiatric Specialty Exam: Review of Systems  All other systems reviewed and are negative.   Blood pressure 117/79, pulse (!) 104, temperature 98.1 F (36.7 C), temperature source Oral, resp. rate 18, height 5\' 10"  (1.778 m), weight 88.5 kg, SpO2 100 %.Body mass index is 27.98 kg/m.  General Appearance: Casual  Eye Contact::  Fair  Speech:  Normal Rate409  Volume:  Normal  Mood:  Euthymic  Affect:  Congruent  Thought Process:  Coherent and Descriptions of Associations: Intact  Orientation:  Full (Time, Place, and Person)  Thought Content:  Logical  Suicidal Thoughts:  No  Homicidal Thoughts:  No  Memory:  Immediate;   Fair Recent;   Fair Remote;   Fair  Judgement:  Intact  Insight:  Fair  Psychomotor Activity:  Normal  Concentration:  Fair  Recall:  Fis[employer]Fair  Fund of Knowledge:Fair  Language: Good  Akathisia:  Negative  Handed:  Right  AIMS (if indicated):     Assets:  Desire for Improvement Physical Health Resilience  Sleep:  Number of Hours: 6.5  Cognition: WNL  ADL's:  Intact   Mental Status Per Nursing Assessment::   On Admission:  Suicidal ideation indicated by patient, Self-harm thoughts, Self-harm behaviors  Demographic Factors:  Male, Caucasian and Unemployed  Loss Factors: NA  Historical Factors: Impulsivity  Risk Reduction Factors:   NA  Continued Clinical Symptoms:  Bipolar Disorder:   Mixed State Alcohol/Substance Abuse/Dependencies  Cognitive Features That Contribute  To Risk:  None    Suicide Risk:  Minimal: No identifiable suicidal ideation.  Patients presenting with no risk factors but with morbid ruminations; may be classified as minimal risk based on the severity of the depressive symptoms  Follow-up Information    [facility]. Go on 07/22/2018.   Specialty:  Behavioral Health Why:  Your next hospital follow up appointment is Thursday, 07/22/18 at 8:00a. Please bring: photo ID, proof of insurance, social security card, and discharge paperwork from this hospitalization.  Contact information: [address] [place] [address] [phone]        [facility] Follow up on 07/21/2018.   Why:  Screening for possible admission scheduled for Wednesday, 07/21/18 at 7:45AM. Please bring: Photo ID/proof of [place] residency, 14 day supply of medications/30day prescriptions, and clothing. Thank you.  Contact information: [place] [place] [address] [phone]           Plan Of Care/Follow-up recommendations:  Activity:  ad lib  Antonieta PertGreg Lawson Clary, MD 07/20/2018, 4:32 PM

## 2018-07-20 NOTE — Progress Notes (Signed)
  [facility]  Will you be returning to the same living situation after discharge:  No. Pt [facility]  At discharge, do you have transportation home?: Yes,  taxi voucher in chart. PT IS SCHEDULED TO DISCHARGE ON WED AT 7:00am ON 12/18 IN ORDER [facility].  Do you have the ability to pay for your medications: Yes,  mental health  Release of information consent forms completed and submitted to medical records by CSW.   Patient to Follow up at: Follow-up Information    [facility]. Go on 07/22/2018.   Specialty:  Behavioral Health Why:  Your next hospital follow up appointment is Thursday, 07/22/18 at 8:00a. Please bring: photo ID, proof of insurance, social security card, and discharge paperwork from this hospitalization.  Contact information: [address] [place] [address] [phone]        [facility] Follow up on 07/21/2018.   Why:  Screening for possible admission scheduled for Wednesday, 07/21/18 at 7:45AM. Please bring: Photo ID/proof of [place] residency, 14 day supply of medications/30day prescriptions, and clothing. Thank you.  Contact information: [place] [place] [address] [phone]           Next level of care provider [facility] Link:no  Safety Planning and Suicide Prevention discussed: Yes,  SPE completed with both pt and his mother. SPI pamphlet provided to pt and mobile crisis information provided to pt.   Have you used any form of tobacco in the last 30 days? (Cigarettes, Smokeless Tobacco, Cigars, and/or Pipes): Yes  Has patient been referred to the Quitline?: Patient refused referral  Patient has been referred for addiction treatment: Yes  Rona RavensHeather S Tayton Decaire, LCSW 07/20/2018, 9:02 AM

## 2018-07-20 NOTE — BHH Group Notes (Signed)
Pt was invited but did not attend orientation/goals group facilitated by [facility] Lequisha C.  

## 2018-07-20 NOTE — Plan of Care (Addendum)
D: Patient is alert, oriented, pleasant, and cooperative. Patient denies SI, HI, AVH, and verbally contracts for safety. Patient reports depression and feelings of a hopeless situation regarding his substance abuse and depression. Patient reports wanting to feel better. Patient reports left hip pain rated 5/10. Patient reports wanting to discontinue Effexor but not Seroquel.    A: One time order obtained for Seroquel. Will report discrepancy to next nurse.  Scheduled medications administered per MD order. Patient refused PRN pain medication offered for reported hip pain. Support provided. Patient educated on safety on the unit and medications. Routine safety checks every 15 minutes. Patient stated understanding to tell nurse about any new physical symptoms. Patient understands to tell staff of any needs.     R: No adverse drug reactions noted. Patient verbally contracts for safety. Patient remains safe at this time and will continue to monitor.    Problem: Education: Goal: Knowledge of [place] [facility] Education information/materials will improve Outcome: Progressing   Problem: Safety: Goal: Periods of time without injury will increase Outcome: Progressing   Patient oriented to the unit. Patient remains safe and will continue to monitor.

## 2018-07-20 NOTE — Progress Notes (Signed)
[facility] MD Progress Note  07/20/2018 10:18 AM Nathan Morton  MRN:  [id number] Subjective:   Patient continues to request an antidepressant we discussed the risks of the venlafaxine discussed as well as the risks of quetiapine [facility] is focused on discharge but of course needs rehab is high risk for relapse discussed. Is wanting to harm self at this point time denies wanting to harm others can contract denies hallucinations.  No EPS no TD no involuntary movements No Seizure activity no acute withdrawal  Principal Problem: MDD (major depressive disorder), recurrent severe, without psychosis (HCC) Diagnosis: Principal Problem:   MDD (major depressive disorder), recurrent severe, without psychosis (HCC) Active Problems:   Bipolar I disorder, most recent episode depressed (HCC)  Total Time spent with patient: 20 minutes   Past Medical History:  Past Medical History:  Diagnosis Date  . Hypertension    History reviewed. No pertinent surgical history. Family History:  Family History  Family history unknown: Yes    Social History:  Social History   Substance and Sexual Activity  Alcohol Use Not Currently     Social History   Substance and Sexual Activity  Drug Use Not Currently    Social History   Socioeconomic History  . Marital status: Single    Spouse name: Not on file  . Number of children: Not on file  . Years of education: Not on file  . Highest education level: Not on file  Occupational History  . Not on file  Social Needs  . Financial resource strain: Not on file  . Food insecurity:    Worry: Not on file    Inability: Not on file  . Transportation needs:    Medical: Not on file    Non-medical: Not on file  Tobacco Use  . Smoking status: Current Every Day Smoker    Packs/day: 1.00    Types: Cigarettes  . Smokeless tobacco: Never Used  Substance and Sexual Activity  . Alcohol use: Not Currently  . Drug use: Not Currently  . Sexual  activity: Not on file  Lifestyle  . Physical activity:    Days per week: Not on file    Minutes per session: Not on file  . Stress: Not on file  Relationships  . Social connections:    Talks on phone: Not on file    Gets together: Not on file    Attends religious service: Not on file    Active member of club or organization: Not on file    Attends meetings of clubs or organizations: Not on file    Relationship status: Not on file  Other Topics Concern  . Not on file  Social History Narrative  . Not on file   Additional Social History:                         Sleep: Good  Appetite:  Good  Current Medications: Current Facility-Administered Medications  Medication Dose Route Frequency Provider Last Rate Last Dose  . acetaminophen (TYLENOL) tablet 650 mg  650 mg Oral Q6H PRN Money, Gerlene Burdock, FNP   650 mg at 07/16/18 2002  . alum & mag hydroxide-simeth (MAALOX/MYLANTA) 200-200-20 MG/5ML suspension 30 mL  30 mL Oral Q4H PRN Money, Feliz Beam B, FNP      . buPROPion (WELLBUTRIN XL) 24 hr tablet 150 mg  150 mg Oral Daily Malvin Johns, MD      . hydrOXYzine (  ATARAX/VISTARIL) tablet 25 mg  25 mg Oral TID PRN Money, Travis B, FNP   25 mg at 07/17/18 1357  . magnesium hydroxide (MILK OF MAGNESIA)Gerlene Burdock suspension 30 mL  30 mL Oral Daily PRN Money, Feliz Beamravis B, FNP      . naltrexone (DEPADE) tablet 50 mg  50 mg Oral Daily Money, Gerlene Burdockravis B, FNP   50 mg at 07/20/18 [id number]  . ondansetron (ZOFRAN) tablet 4 mg  4 mg Oral Q6H PRN Money, Gerlene Burdockravis B, FNP        Lab Results: No results found for this or any previous visit (from the past 48 hour(s)).  Blood Alcohol level:  Lab Results  Component Value Date   ETH <10 07/11/2018    Metabolic Disorder Labs: Lab Results  Component Value Date   HGBA1C 5.3 07/12/2018   MPG 105.41 07/12/2018   MPG 102.54 06/29/2018   Lab Results  Component Value Date   PROLACTIN 21.8 (H) 06/30/2018   PROLACTIN 26.3 (H) 06/29/2018   Lab Results  Component Value  Date   CHOL 145 06/29/2018   TRIG 149 06/29/2018   HDL 34 (L) 06/29/2018   CHOLHDL 4.3 06/29/2018   VLDL 30 06/29/2018   LDLCALC 81 06/29/2018    Physical Findings: AIMS: Facial and Oral Movements Muscles of Facial Expression: None, normal Lips and Perioral Area: None, normal Jaw: None, normal Tongue: None, normal,Extremity Movements Upper (arms, wrists, hands, fingers): None, normal Lower (legs, knees, ankles, toes): None, normal, Trunk Movements Neck, shoulders, hips: None, normal, Overall Severity Severity of abnormal movements (highest score from questions above): None, normal Incapacitation due to abnormal movements: None, normal Patient's awareness of abnormal movements (rate only patient's report): No Awareness, Dental Status Current problems with teeth and/or dentures?: No Does patient usually wear dentures?: No  CIWA:    COWS:     Musculoskeletal: Strength & Muscle Tone: within normal limits Gait & Station: normal  Psychiatric Specialty Exam: Physical Exam  ROS  Blood pressure 117/79, pulse (!) 104, temperature 98.1 F (36.7 C), temperature source Oral, resp. rate 18, height 5\' 10"  (1.778 m), weight 88.5 kg, SpO2 100 %.Body mass index is 27.98 kg/m.  General Appearance: Casual  Eye Contact:  Good  Speech:  Clear and Coherent  Volume:  Normal  Mood:  Dysphoric  Affect:  Appropriate  Thought Process:  Coherent  Orientation:  Full (Time, Place, and Person)  Thought Content:  Logical  Suicidal Thoughts:  No  Homicidal Thoughts:  No  Memory:  Immediate;   Fair  Judgement:  Fair  Insight:  Fair  Psychomotor Activity:  Normal  Concentration:  Concentration: Good  Recall:  Good  Fund of Knowledge:  Fair  Language:  Good  Akathisia:  Negative  Handed:  Right  AIMS (if indicated):     Assets:  Desire for Improvement  ADL's:  Intact  Cognition:  WNL  Sleep:  Number of Hours: 6.5   For chemical dependency issues continue to monitor for withdrawal continue  craving medications, for depression on bupropion for longer-term stability [facility]   Treatment Plan Summary: Daily contact with patient to assess and evaluate symptoms and progress in treatment and Medication management  Taralynn Quiett, MD 07/20/2018, 10:18 AM

## 2018-07-20 NOTE — Progress Notes (Signed)
Adult Psychoeducational Group Note  Date:  07/20/2018 Time:  10:00 PM  Group Topic/Focus:  Wrap-Up Group:   The focus of this group is to help patients review their daily goal of treatment and discuss progress on daily workbooks.  Participation Level:  Active  Participation Quality:  Appropriate  Affect:  Appropriate  Cognitive:  Appropriate  Insight: Appropriate  Engagement in Group:  Engaged  Modes of Intervention:  Discussion  Additional Comments:  Pt goal was to come up with positive reasons [facility].  Pt rated the day at a 4/10.  Lisvet Rasheed 07/20/2018, 10:00 PM

## 2018-07-20 NOTE — BHH Group Notes (Signed)
[facility] 07/20/2018 1:15pm  Type of Therapy: Mental Health Association Presentation  Participation Level: Invited to Attend. Pt chose to remain in bed. DID NOT ATTEND.  Rona RavensHeather S Adian Jablonowski, LCSW 07/20/2018 2:23 PM

## 2018-07-20 NOTE — Plan of Care (Signed)
  Problem: Medication: Goal: Compliance with prescribed medication regimen will improve Outcome: Progressing   Problem: Safety: Goal: Periods of time without injury will increase Outcome: Progressing  DAR NOTE: Patient presents with flat affect and depressed mood.  Denies suicidal thoughts, auditory and visual hallucinations.  Described energy level as normal and concentration as good.  Rates depression at 5, hopelessness at 5, and anxiety at 2.  Maintained on routine safety checks.  Medications given as prescribed.  Support and encouragement offered as needed.  Attended group and participated.  States goal for today is "stay positive."  Patient visible in milieu with minimal interaction.   Refused Wellbutrin due to complain of side effect.  NP made aware.  Patient is safe on and off the unit.

## 2018-07-20 NOTE — Progress Notes (Signed)
Recreation Therapy Notes  Animal-Assisted Activity (AAA) Program Checklist/Progress Notes Patient Eligibility Criteria Checklist & Daily Group note for Rec Tx Intervention  Date: 12.17.19 Time: 1430 Location: [address] Dayroom   AAA/T Program Assumption of Risk Form signed by Patient/ or Parent Legal Guardian  YES  Patient is free of allergies or sever asthma  YES   Patient reports no fear of animals  YES   Patient reports no history of cruelty to animals YES   Patient understands his/her participation is voluntary  YES   Patient washes hands before animal contact YES  Patient washes hands after animal contact  YES   Education: Hand Washing, Appropriate Animal Interaction   Education Outcome: Acknowledges understanding/In group clarification offered/Needs additional education.   Clinical Observations/Feedback: Pt did not group activity.    Safire Gordin, LRT/CTRS         Rodney Wigger A 07/20/2018 3:08 PM 

## 2018-07-21 NOTE — Plan of Care (Signed)
D: Patient is alert, oriented, pleasant, and cooperative. Patient denies SI, HI, AVH, and verbally contracts for safety. Patient reports cravings for substance abuse. Patient reports anxiety 9/10. Patient requests PEN medication for anxiety and sleep.     A: Medications administered per MD order. Support provided. Patient educated on safety on the unit and medications. Routine safety checks every 15 minutes. Patient stated understanding to tell nurse about any new physical symptoms. Patient understands to tell staff of any needs.     R: No adverse drug reactions noted. Patient verbally contracts for safety. Patient remains safe at this time and will continue to monitor.   Problem: Education: Goal: Verbalization of understanding the information provided will improve Outcome: Progressing   Problem: Safety: Goal: Periods of time without injury will increase Outcome: Progressing   Problem: Education: Goal: Knowledge of disease or condition will improve Outcome: Progressing  Patient verbalizes understanding of information provided. Patient verbalizes knowledge of disease/condition. Patient remains safe and will continue to monitor.

## 2018-07-21 NOTE — Discharge Summary (Signed)
Physician Discharge Summary Note  Patient:  Nathan Morton is an 27 y.o., male MRN:  [id number] DOB:  25-Mar-1991 Patient phone:  [phone] (home)  Patient address:   [address] [place] [address],  Total Time spent with patient: 15 minutes  Date of Admission:  07/16/2018 Date of Discharge: 07/21/18  Reason for Admission:  Depression, relapse on opioids  Principal Problem: MDD (major depressive disorder), recurrent severe, without psychosis (HCC) Discharge Diagnoses: Principal Problem:   MDD (major depressive disorder), recurrent severe, without psychosis (HCC) Active Problems:   Bipolar I disorder, most recent episode depressed (HCC)   Past Psychiatric History: See admission H&P   Past Medical History:  Past Medical History:  Diagnosis Date  . Hypertension    History reviewed. No pertinent surgical history. Family History:  Family History  Family history unknown: Yes   Family Psychiatric  History: See admission H&P Social History:  Social History   Substance and Sexual Activity  Alcohol Use Not Currently     Social History   Substance and Sexual Activity  Drug Use Not Currently    Social History   Socioeconomic History  . Marital status: Single    Spouse name: Not on file  . Number of children: Not on file  . Years of education: Not on file  . Highest education level: Not on file  Occupational History  . Not on file  Social Needs  . Financial resource strain: Not on file  . Food insecurity:    Worry: Not on file    Inability: Not on file  . Transportation needs:    Medical: Not on file    Non-medical: Not on file  Tobacco Use  . Smoking status: Current Every Day Smoker    Packs/day: 1.00    Types: Cigarettes  . Smokeless tobacco: Never Used  Substance and Sexual Activity  . Alcohol use: Not Currently  . Drug use: Not Currently  . Sexual activity: Not on file  Lifestyle  . Physical activity:    Days per week: Not on file     Minutes per session: Not on file  . Stress: Not on file  Relationships  . Social connections:    Talks on phone: Not on file    Gets together: Not on file    Attends religious service: Not on file    Active member of club or organization: Not on file    Attends meetings of clubs or organizations: Not on file    Relationship status: Not on file  Other Topics Concern  . Not on file  Social History Narrative  . Not on file    Hospital Course:  From admission H&P: 27 year old male,known to our unit from recent psychiatric admission from 11/26 through 11/29. At the time was admitted for depression, suicidal ideations. He was diagnosed with Bipolar Disorder, Substance Abuse ( at the time was sober) and was discharged on Seroquel and Naltrexone. He presented to the hospital on 12/8 , reporting depression , relapse on opiates, and suicidal attempt by overdosing . He reports he had recently relapsed after  period of about two months of sobriety. States he has little memory of event, but thinks he called a friend who brought him to the hospital. Patient was briefly admitted to medical unit for AKI and Rhabdomyolisis. Work up also revealed patchy interstitial finding on RUL, felt to be pulmonary contusion or pneumonia. Describes recent stressors include GF telling him she was planning on  moving out of state ,socializing with someone who was actively using drugs, being told that he did not qualify to get into a rehab. Patient reports he has been depressed recently " even when things were OK". States he was having difficulty finding an appropriate dose of Seroquel, which he felt was causing excessive sedation at prescribed dose. Endorses some neuro-vegetative symptoms as below. Denies psychotic symptoms. Patient denies symptoms of opiate WDL and as above, states he used heroin for one day only, before which he had been sober for several weeks.  Mr. Nathan MorasOwen was admitted for depression with recent opioid relapse. He  was treated and discharged with the medications listed below under Medication List. Improvement was monitored by observation and Mr. Nathan MorasOwen 's daily report of symptom reduction.  Emotional and mental status was monitored by daily self-inventory reports completed by Mr. Nathan Moraswen and clinical staff.         Mr. Nathan MorasOwen was evaluated by the treatment team for stability and plans for continued recovery upon discharge. Pt was offered further treatment options upon discharge including but not limited to Residential, Intensive Outpatient, and Outpatient treatment.  Mr. Nathan MorasOwen will follow up with the services as listed below under Follow Up Information, discharging to appointment for residential recovery treatment at [facility]. Upon completion of this admission the patient was both mentally and medically stable for discharge denying suicidal/homicidal ideation, auditory/visual/tactile hallucinations, delusional thoughts and paranoia.    Physical Findings: AIMS: Facial and Oral Movements Muscles of Facial Expression: None, normal Lips and Perioral Area: None, normal Jaw: None, normal Tongue: None, normal,Extremity Movements Upper (arms, wrists, hands, fingers): None, normal Lower (legs, knees, ankles, toes): None, normal, Trunk Movements Neck, shoulders, hips: None, normal, Overall Severity Severity of abnormal movements (highest score from questions above): None, normal Incapacitation due to abnormal movements: None, normal Patient's awareness of abnormal movements (rate only patient's report): No Awareness, Dental Status Current problems with teeth and/or dentures?: No Does patient usually wear dentures?: No  CIWA:    COWS:     Musculoskeletal: Strength & Muscle Tone: within normal limits Gait & Station: normal Patient leans: N/A  Psychiatric Specialty Exam: Physical Exam  Nursing note and vitals reviewed. Constitutional: He is oriented to person, place, and time.  Neurological: He is alert and oriented to  person, place, and time.     Review of Systems  Psychiatric/Behavioral: Positive for depression (Stable with treatment) and substance abuse (Hx opioids). Negative for hallucinations, memory loss and suicidal ideas. The patient is not nervous/anxious and does not have insomnia.   All other systems reviewed and are negative.   Blood pressure 117/79, pulse (!) 104, temperature 98.1 F (36.7 C), temperature source Oral, resp. rate 18, height 5\' 10"  (1.778 m), weight 88.5 kg, SpO2 100 %.Body mass index is 27.98 kg/m.  See MD's discharge SRA     Have you used any form of tobacco in the last 30 days? (Cigarettes, Smokeless Tobacco, Cigars, and/or Pipes): Yes  Has this patient used any form of tobacco in the last 30 days? (Cigarettes, Smokeless Tobacco, Cigars, and/or Pipes) Yes, an FDA-approved over the counter tobacco cessation method was offered at discharge.  Blood Alcohol level:  Lab Results  Component Value Date   ETH <10 07/11/2018    Metabolic Disorder Labs:  Lab Results  Component Value Date   HGBA1C 5.3 07/12/2018   MPG 105.41 07/12/2018   MPG 102.54 06/29/2018   Lab Results  Component Value Date   PROLACTIN 21.8 (H) 06/30/2018  PROLACTIN 26.3 (H) 06/29/2018   Lab Results  Component Value Date   CHOL 145 06/29/2018   TRIG 149 06/29/2018   HDL 34 (L) 06/29/2018   CHOLHDL 4.3 06/29/2018   VLDL 30 06/29/2018   LDLCALC 81 06/29/2018    See Psychiatric Specialty Exam and Suicide Risk Assessment completed by Attending Physician prior to discharge.  Discharge destination:  [facility]  Is patient on multiple antipsychotic therapies at discharge:  No   Has Patient had three or more failed trials of antipsychotic monotherapy by history:  No  Recommended Plan for Multiple Antipsychotic Therapies: NA   Allergies as of 07/21/2018      Reactions   Adderall [amphetamine-dextroamphetamine] Rash      Medication List    STOP taking these medications    QUEtiapine 100 MG tablet Commonly known as:  SEROQUEL     TAKE these medications     Indication  buPROPion 150 MG 24 hr tablet Commonly known as:  WELLBUTRIN XL Take 1 tablet (150 mg total) by mouth daily. Take 1 tablet by mouth daily for depression.  Indication:  Major Depressive Disorder   hydrOXYzine 25 MG tablet Commonly known as:  ATARAX/VISTARIL Take 1 tablet (25 mg total) by mouth 3 (three) times daily as needed for anxiety. What changed:    medication strength  how much to take  when to take this  Indication:  Feeling Anxious   naltrexone 50 MG tablet Commonly known as:  DEPADE Take 1 tablet (50 mg total) by mouth daily. Take 1 tablet by mouth daily for opioid cravings. What changed:  additional instructions  Indication:  Opioid Dependence, Alcohol craving      Follow-up Information    [facility]. Go on 07/22/2018.   Specialty:  Behavioral Health Why:  Your next hospital follow up appointment is Thursday, 07/22/18 at 8:00a. Please bring: photo ID, proof of insurance, social security card, and discharge paperwork from this hospitalization.  Contact information: [address] [place] [address] [phone]        [facility], [facility] Follow up on 07/21/2018.   Why:  Screening for possible admission scheduled for Wednesday, 07/21/18 at 7:45AM. Please bring: Photo ID/proof of [employer], 14 day supply of medications/30day prescriptions, and clothing. Thank you.  Contact information: [address] [place] [address] [phone]           Follow-up recommendations: Activity as tolerated. Diet as recommended by primary care physician. Keep all scheduled follow-up appointments as recommended.    Comments:   Patient is instructed to take all prescribed medications as recommended. Report any side effects or adverse reactions to your outpatient psychiatrist. Patient is instructed to abstain from alcohol and illegal drugs while  on prescription medications. In the event of worsening symptoms, patient is instructed to call the crisis hotline, 911, or go to the nearest emergency department for evaluation and treatment.  Signed: Aldean Baker, NP 07/21/2018, 2:22 PM

## 2018-07-21 NOTE — Progress Notes (Signed)
Patient verbalizes readiness for discharge. All belongings returned to patient including valuables. Discharge instructions read and discussed (medications and resources).

## 2018-10-03 ENCOUNTER — Other Ambulatory Visit: Payer: Self-pay

## 2018-10-03 DIAGNOSIS — L03221 Cellulitis of neck: Secondary | ICD-10-CM | POA: Insufficient documentation

## 2018-10-03 DIAGNOSIS — I10 Essential (primary) hypertension: Secondary | ICD-10-CM | POA: Insufficient documentation

## 2018-10-03 DIAGNOSIS — Z79899 Other long term (current) drug therapy: Secondary | ICD-10-CM | POA: Insufficient documentation

## 2018-10-03 DIAGNOSIS — F17210 Nicotine dependence, cigarettes, uncomplicated: Secondary | ICD-10-CM | POA: Insufficient documentation

## 2018-10-04 ENCOUNTER — Other Ambulatory Visit: Payer: Self-pay

## 2018-10-04 ENCOUNTER — Emergency Department (HOSPITAL_COMMUNITY)
Admission: EM | Admit: 2018-10-04 | Discharge: 2018-10-04 | Disposition: A | Discharge status: Home / Self Care | Payer: Self-pay | Attending: Emergency Medicine | Admitting: Emergency Medicine

## 2018-10-04 ENCOUNTER — Encounter (HOSPITAL_COMMUNITY): Payer: Self-pay | Admitting: Obstetrics and Gynecology

## 2018-10-04 DIAGNOSIS — L03221 Cellulitis of neck: Secondary | ICD-10-CM

## 2018-10-04 LAB — HIV ANTIBODY (ROUTINE TESTING W REFLEX): HIV Screen 4th Generation wRfx: NONREACTIVE

## 2018-10-04 MED ORDER — CLINDAMYCIN HCL 150 MG PO CAPS: 300.0000 mg | ORAL_CAPSULE | Freq: Four times a day (QID) | ORAL | 0 refills | Status: DC

## 2018-10-04 MED ORDER — DEXAMETHASONE SODIUM PHOSPHATE 10 MG/ML IJ SOLN
10.0000 mg | Freq: Once | INTRAMUSCULAR | Status: AC
  Administered 2018-10-04: 10 mg via INTRAMUSCULAR
  Filled 2018-10-04: qty 1

## 2018-10-04 MED ORDER — CLINDAMYCIN PHOSPHATE 600 MG/4ML IJ SOLN
600.0000 mg | Freq: Once | INTRAMUSCULAR | Status: AC
  Administered 2018-10-04: 600 mg via INTRAMUSCULAR
  Filled 2018-10-04: qty 4

## 2018-10-04 MED ORDER — HYDROCORTISONE 1 % EX OINT: 1.0000 "application " | TOPICAL_OINTMENT | Freq: Two times a day (BID) | CUTANEOUS | 0 refills | Status: DC

## 2018-10-04 NOTE — ED Triage Notes (Signed)
Patient recently got a tattoo on his neck and the area is raised and red with obvious swelling. Pt reports the tattoo artist did not open the needles in front of him and he is concerned about infection.

## 2018-10-04 NOTE — ED Provider Notes (Signed)
[facility]-EMERGENCY DEPT Provider Note   CSN: [id number] Arrival date & time: 10/03/18  2351    History   Chief Complaint Chief Complaint  Patient presents with  . Neck Pain    HPI Nathan Morton is a 28 y.o. male.     Patient is concerned that he might have an infection on the left side of his neck after getting a tattoo yesterday.  He reports that he went to a new tattoo artist and conditions were not very clean.  He noticed some swelling and redness today, pain at the site.  There is no drainage.  He has not had a fever.     Past Medical History:  Diagnosis Date  . Hypertension     Patient Active Problem List   Diagnosis Date Noted  . MDD (major depressive disorder), recurrent severe, without psychosis (HCC) 07/17/2018  . Bipolar I disorder, most recent episode depressed (HCC) 07/16/2018  . CAP (community acquired pneumonia) 07/12/2018  . Sepsis (HCC) 07/12/2018  . ARF (acute renal failure) (HCC) 07/12/2018  . Suicide attempt (HCC)   . Bipolar 1 disorder, depressed, severe (HCC) 06/29/2018  . PTSD (post-traumatic stress disorder) 06/29/2018  . Polysubstance dependence (HCC) 06/29/2018    No past surgical history on file.      Home Medications    Prior to Admission medications   Medication Sig Start Date End Date Taking? Authorizing Provider  buPROPion (WELLBUTRIN XL) 150 MG 24 hr tablet Take 1 tablet (150 mg total) by mouth daily. Take 1 tablet by mouth daily for depression. 07/21/18   Aldean Baker, NP  clindamycin (CLEOCIN) 150 MG capsule Take 2 capsules (300 mg total) by mouth 4 (four) times daily. 10/04/18   Gilda Crease, MD  hydrocortisone 1 % ointment Apply 1 application topically 2 (two) times daily. 10/04/18   Gilda Crease, MD  hydrOXYzine (ATARAX/VISTARIL) 25 MG tablet Take 1 tablet (25 mg total) by mouth 3 (three) times daily as needed for anxiety. 07/20/18   Aldean Baker, NP  naltrexone (DEPADE) 50 MG tablet  Take 1 tablet (50 mg total) by mouth daily. Take 1 tablet by mouth daily for opioid cravings. 07/20/18   Aldean Baker, NP    Family History Family History  Family history unknown: Yes    Social History Social History   Tobacco Use  . Smoking status: Current Every Day Smoker    Packs/day: 1.00    Types: Cigarettes  . Smokeless tobacco: Never Used  Substance Use Topics  . Alcohol use: Not Currently  . Drug use: Not Currently    Comment: Patient reports he is in drug recovery     Allergies   Adderall [amphetamine-dextroamphetamine]   Review of Systems Review of Systems  Skin: Positive for rash.  All other systems reviewed and are negative.    Physical Exam Updated Vital Signs BP (!) 136/94 (BP Location: Left Arm)   Pulse 99   Temp 97.9 F (36.6 C) (Oral)   Resp 18   SpO2 100%   Physical Exam Constitutional:      Appearance: Normal appearance.  HENT:     Head: Normocephalic and atraumatic.  Neck:     Musculoskeletal: Normal range of motion and neck supple.  Cardiovascular:     Rate and Rhythm: Normal rate and regular rhythm.  Pulmonary:     Effort: Pulmonary effort is normal.     Breath sounds: Normal breath sounds.  Musculoskeletal: Normal range of motion.  Skin:    Capillary Refill: Capillary refill takes less than 2 seconds.     Comments: Erythema associated with the lines of tattoo on left neck.  Neurological:     General: No focal deficit present.     Mental Status: He is alert and oriented to person, place, and time.  Psychiatric:        Mood and Affect: Mood normal.        ED Treatments / Results  Labs (all labs ordered are listed, but only abnormal results are displayed) Labs Reviewed  HEPATITIS PANEL, ACUTE  HIV ANTIBODY (ROUTINE TESTING W REFLEX)    EKG None  Radiology No results found.  Procedures Procedures (including critical care time)  Medications Ordered in ED Medications  clindamycin (CLEOCIN) injection 600 mg  (has no administration in time range)  dexamethasone (DECADRON) injection 10 mg (has no administration in time range)     Initial Impression / Assessment and Plan / ED Course  I have reviewed the triage vital signs and the nursing notes.  Pertinent labs & imaging results that were available during my care of the patient were reviewed by me and considered in my medical decision making (see chart for details).        Patient presents to the emergency department for evaluation of erythema and swelling at the site of a tattoo that he acquired yesterday.  There is redness that is very well demarcated associated with lines of the tattoo.  Also, looking at the ink itself, the area where there is a large amount of ink, ink appears to be globular and clumped up, suspect that this is an allergic reaction to something in the ink as opposed to infection.  Will, however, require treatment for infection as well.  Treat with Decadron and clindamycin, continue oral clindamycin and topical hydrocortisone.  Return for worsening symptoms.  Final Clinical Impressions(s) / ED Diagnoses   Final diagnoses:  Cellulitis of neck    ED Discharge Orders         Ordered    clindamycin (CLEOCIN) 150 MG capsule  4 times daily,   Status:  Discontinued     10/04/18 0153    hydrocortisone 1 % ointment  2 times daily,   Status:  Discontinued     10/04/18 0153    hydrocortisone 1 % ointment  2 times daily     10/04/18 0153    clindamycin (CLEOCIN) 150 MG capsule  4 times daily     10/04/18 0153           Gilda Crease, MD 10/04/18 [phone]

## 2018-10-05 LAB — HEPATITIS PANEL, ACUTE
HCV Ab: <0.1 s/co ratio (ref 0.0–0.9)
HEP A IGM: NEGATIVE
Hep B C IgM: NEGATIVE
Hepatitis B Surface Ag: NEGATIVE

## 2019-01-15 ENCOUNTER — Emergency Department (HOSPITAL_COMMUNITY): Payer: Self-pay

## 2019-01-15 ENCOUNTER — Other Ambulatory Visit: Payer: Self-pay

## 2019-01-15 ENCOUNTER — Encounter (HOSPITAL_COMMUNITY): Payer: Self-pay | Admitting: Emergency Medicine

## 2019-01-15 ENCOUNTER — Emergency Department (HOSPITAL_COMMUNITY)
Admission: EM | Admit: 2019-01-15 | Discharge: 2019-01-15 | Disposition: A | Discharge status: Home / Self Care | Payer: Self-pay | Attending: Emergency Medicine | Admitting: Emergency Medicine

## 2019-01-15 DIAGNOSIS — Z79899 Other long term (current) drug therapy: Secondary | ICD-10-CM | POA: Insufficient documentation

## 2019-01-15 DIAGNOSIS — Y939 Activity, unspecified: Secondary | ICD-10-CM | POA: Insufficient documentation

## 2019-01-15 DIAGNOSIS — I10 Essential (primary) hypertension: Secondary | ICD-10-CM | POA: Insufficient documentation

## 2019-01-15 DIAGNOSIS — S060X0A Concussion without loss of consciousness, initial encounter: Secondary | ICD-10-CM | POA: Insufficient documentation

## 2019-01-15 DIAGNOSIS — F17200 Nicotine dependence, unspecified, uncomplicated: Secondary | ICD-10-CM | POA: Insufficient documentation

## 2019-01-15 DIAGNOSIS — F4310 Post-traumatic stress disorder, unspecified: Secondary | ICD-10-CM | POA: Insufficient documentation

## 2019-01-15 DIAGNOSIS — Y929 Unspecified place or not applicable: Secondary | ICD-10-CM | POA: Insufficient documentation

## 2019-01-15 DIAGNOSIS — Y999 Unspecified external cause status: Secondary | ICD-10-CM | POA: Insufficient documentation

## 2019-01-15 DIAGNOSIS — W010XXA Fall on same level from slipping, tripping and stumbling without subsequent striking against object, initial encounter: Secondary | ICD-10-CM | POA: Insufficient documentation
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
## 2019-01-15 NOTE — ED Notes (Signed)
Patient transported to CT 

## 2019-01-15 NOTE — ED Notes (Signed)
Bed: QA06 Expected date:  Expected time:  Means of arrival:  Comments: EMS 28 yo male rolled out of bed and hit head on side table-large head lac-now is dizzy and nauseated-Zofran
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
## 2019-02-24 ENCOUNTER — Other Ambulatory Visit: Payer: Self-pay

## 2019-02-24 ENCOUNTER — Encounter (HOSPITAL_COMMUNITY): Payer: Self-pay | Admitting: Rehabilitation

## 2019-02-24 ENCOUNTER — Inpatient Hospital Stay (HOSPITAL_COMMUNITY)
Admission: RE | Admit: 2019-02-24 | Discharge: 2019-02-27 | DRG: 885 | Disposition: A | Discharge status: Home / Self Care | Payer: Federal, State, Local not specified - Other | Attending: Psychiatry | Admitting: Psychiatry

## 2019-02-24 DIAGNOSIS — F603 Borderline personality disorder: Secondary | ICD-10-CM | POA: Diagnosis present

## 2019-02-24 DIAGNOSIS — R45851 Suicidal ideations: Secondary | ICD-10-CM | POA: Diagnosis present

## 2019-02-24 DIAGNOSIS — F17290 Nicotine dependence, other tobacco product, uncomplicated: Secondary | ICD-10-CM | POA: Diagnosis present

## 2019-02-24 DIAGNOSIS — Z20828 Contact with and (suspected) exposure to other viral communicable diseases: Secondary | ICD-10-CM | POA: Diagnosis present

## 2019-02-24 DIAGNOSIS — F1121 Opioid dependence, in remission: Secondary | ICD-10-CM | POA: Diagnosis present

## 2019-02-24 DIAGNOSIS — Z915 Personal history of self-harm: Secondary | ICD-10-CM | POA: Diagnosis not present

## 2019-02-24 DIAGNOSIS — F314 Bipolar disorder, current episode depressed, severe, without psychotic features: Secondary | ICD-10-CM | POA: Diagnosis present

## 2019-02-24 DIAGNOSIS — F4310 Post-traumatic stress disorder, unspecified: Secondary | ICD-10-CM | POA: Diagnosis present

## 2019-02-24 DIAGNOSIS — Z818 Family history of other mental and behavioral disorders: Secondary | ICD-10-CM

## 2019-02-24 LAB — RAPID URINE DRUG SCREEN, HOSP PERFORMED
Amphetamines: NOT DETECTED
Barbiturates: NOT DETECTED
Benzodiazepines: NOT DETECTED
Cocaine: NOT DETECTED
Opiates: NOT DETECTED
Tetrahydrocannabinol: NOT DETECTED

## 2019-02-24 LAB — SARS CORONAVIRUS 2 BY RT PCR (HOSPITAL ORDER, PERFORMED IN ~~LOC~~ HOSPITAL LAB): SARS Coronavirus 2: NEGATIVE

## 2019-02-24 MED ORDER — PRAZOSIN HCL 1 MG PO CAPS
1.0000 mg | ORAL_CAPSULE | Freq: Every day | ORAL | Status: DC
  Administered 2019-02-24 – 2019-02-26 (×3): 1 mg via ORAL
  Filled 2019-02-24: qty 1
  Filled 2019-02-24: qty 7
  Filled 2019-02-24: qty 1
  Filled 2019-02-24: qty 7
  Filled 2019-02-24 (×2): qty 1

## 2019-02-24 MED ORDER — HYDROXYZINE HCL 25 MG PO TABS
25.0000 mg | ORAL_TABLET | Freq: Three times a day (TID) | ORAL | Status: DC | PRN
  Administered 2019-02-25 – 2019-02-26 (×2): 25 mg via ORAL
  Filled 2019-02-24 (×2): qty 1
  Filled 2019-02-24: qty 10
  Filled 2019-02-24: qty 1

## 2019-02-24 MED ORDER — TRAZODONE HCL 50 MG PO TABS
50.0000 mg | ORAL_TABLET | Freq: Every evening | ORAL | Status: DC | PRN
  Filled 2019-02-24 (×2): qty 1

## 2019-02-24 MED ORDER — LAMOTRIGINE 100 MG PO TABS
100.0000 mg | ORAL_TABLET | Freq: Every day | ORAL | Status: DC
  Administered 2019-02-24: 100 mg via ORAL
  Filled 2019-02-24 (×5): qty 1

## 2019-02-24 MED ORDER — ALUM & MAG HYDROXIDE-SIMETH 200-200-20 MG/5ML PO SUSP: 30.0000 mL | ORAL | Status: DC | PRN

## 2019-02-24 MED ORDER — MAGNESIUM HYDROXIDE 400 MG/5ML PO SUSP: 30.0000 mL | Freq: Every day | ORAL | Status: DC | PRN

## 2019-02-24 MED ORDER — ARIPIPRAZOLE 10 MG PO TABS
10.0000 mg | ORAL_TABLET | Freq: Every day | ORAL | Status: DC
  Administered 2019-02-24: 10 mg via ORAL
  Filled 2019-02-24 (×5): qty 1

## 2019-02-24 MED ORDER — ACETAMINOPHEN 325 MG PO TABS
650.0000 mg | ORAL_TABLET | Freq: Four times a day (QID) | ORAL | Status: DC | PRN
  Administered 2019-02-25 – 2019-02-26 (×2): 650 mg via ORAL
  Filled 2019-02-24 (×2): qty 2

## 2019-02-24 MED ORDER — SERTRALINE HCL 100 MG PO TABS
100.0000 mg | ORAL_TABLET | Freq: Every day | ORAL | Status: DC
  Administered 2019-02-24 – 2019-02-25 (×2): 100 mg via ORAL
  Filled 2019-02-24 (×5): qty 1
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
## 2019-02-24 NOTE — Progress Notes (Signed)
Nathan Morton is a 28 year old male admitted voluntarily after endorsing suicidal ideation.  He reports a long history of depression since second grade.  He states that he has had periodic treatment for substance abuse and suicidal thoughts.  He has a history of heroin and opioid abuse, but he has been clean for the last four months.  The suicidal thoughts have become significantly worse over the last 2 weeks and tonight he became upset with his finance.  He saw texts between her and a friend stating that they thought he was unstable and needed help.  He says that he became upset and was verbally aggressive to her, "worse than ever before".  His plan was to cut himself and "see where it goes from there".  He does have auditory hallucinations that are non-command in nature.  He endorses current suicidal ideation, but contracts for safety on the unit.     [place] NOVEL CORONAVIRUS (COVID-19) DAILY CHECK-OFF SYMPTOMS - answer yes or no to each - every day NO YES  Have you had a fever in the past 24 hours?  . Fever (Temp > 37.80C / 100F) X   Have you had any of these symptoms in the past 24 hours? . New Cough .  Sore Throat  .  Shortness of Breath .  Difficulty Breathing .  Unexplained Body Aches   X   Have you had any one of these symptoms in the past 24 hours not related to allergies?   . Runny Nose .  Nasal Congestion .  Sneezing   X   If you have had runny nose, nasal congestion, sneezing in the past 24 hours, has it worsened?  X   EXPOSURES - check yes or no X   Have you traveled outside the state in the past 14 days?  X   Have you been in contact with someone with a confirmed diagnosis of COVID-19 or PUI in the past 14 days without wearing appropriate PPE?  X   Have you been living in the same home as a person with confirmed diagnosis of COVID-19 or a PUI (household contact)?    X   Have you been diagnosed with COVID-19?    X              What to do next: Answered NO to all:  Answered YES to anything:   Proceed with unit schedule Follow the [facility] Inpatient Flowsheet.

## 2019-02-24 NOTE — H&P (Signed)
Behavioral Health Medical Screening Exam  Nathan Morton is an 28 y.o. male.  Total Time spent with patient: 20 minutes  Psychiatric Specialty Exam: Physical Exam  Constitutional: He is oriented to person, place, and time. He appears well-developed and well-nourished. No distress.  HENT:  Head: Normocephalic and atraumatic.  Right Ear: External ear normal.  Left Ear: External ear normal.  Eyes: Pupils are equal, round, and reactive to light. Right eye exhibits no discharge. Left eye exhibits no discharge.  Respiratory: Effort normal. No respiratory distress.  Musculoskeletal: Normal range of motion.  Neurological: He is alert and oriented to person, place, and time.  Skin: He is not diaphoretic.  Psychiatric: His mood appears anxious. He is not withdrawn and not actively hallucinating. Thought content is not paranoid and not delusional. He expresses impulsivity and inappropriate judgment. He exhibits a depressed mood. He expresses suicidal ideation. He expresses no homicidal ideation. He expresses suicidal plans.    Review of Systems  Constitutional: Negative for chills, diaphoresis, fever, malaise/fatigue and weight loss.  Respiratory: Negative for cough and shortness of breath.   Cardiovascular: Negative for chest pain.  Gastrointestinal: Negative for diarrhea, nausea and vomiting.  Psychiatric/Behavioral: Positive for depression and suicidal ideas. Negative for hallucinations, memory loss and substance abuse. The patient is nervous/anxious and has insomnia.   All other systems reviewed and are negative.   There were no vitals taken for this visit.There is no height or weight on file to calculate BMI.  General Appearance: Casual and Well Groomed  Eye Contact:  Fair  Speech:  Clear and Coherent and Normal Rate  Volume:  Normal  Mood:  Anxious, Depressed, Hopeless and Worthless  Affect:  Congruent and Depressed  Thought Process:  Coherent, Goal Directed and Descriptions of  Associations: Intact  Orientation:  Full (Time, Place, and Person)  Thought Content:  Logical and Hallucinations: None  Suicidal Thoughts:  Yes.  with intent/plan  Homicidal Thoughts:  No  Memory:  Immediate;   Good Recent;   Good  Judgement:  Impaired  Insight:  Lacking  Psychomotor Activity:  Normal  Concentration: Concentration: Fair and Attention Span: Fair  Recall:  [employer] of Knowledge:Good  Language: Good  Akathisia:  Negative  Handed:  Right  AIMS (if indicated):     Assets:  Communication Skills Desire for Improvement Housing Intimacy Leisure Time Physical Health Resilience  Sleep:       Musculoskeletal: Strength & Muscle Tone: within normal limits Gait & Station: normal Patient leans: N/A  There were no vitals taken for this visit.  Recommendations:  Based on my evaluation the patient does not appear to have an emergency medical condition.  Rozetta Nunnery, NP 02/24/2019, 12:31 AM
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
## 2019-02-24 NOTE — Progress Notes (Signed)
Patient ID: Nathan Morton, male   DOB: 1991/03/31, 28 y.o.   MRN: [id number]  Nursing Progress Note [phone]  Patient presents sad/sullen and depressed and does not brighten much during interactions. Patient compliant with scheduled medications and denies need for PRNs. Patient is isolative to his room. Patient currently denies SI/HI/AVH. Urine specimen obtained.  Patient is educated about and provided medication per provider's orders. Patient safety maintained with q15 min safety checks and low fall risk precautions. Emotional support given, 1:1 interaction, and active listening provided. Patient encouraged to attend meals, groups, and work on treatment plan and goals. Labs, vital signs and patient behavior monitored throughout shift. Patient encouraged to wear mask when in the milieu and is educated about coronavirus infection control precautions.  Patient compliant with mask on the unit. Patient contracts for safety with staff. Patient remains safe on the unit at this time and agrees to come to staff with any issues/concerns. Patient is interacting with peers appropriately on the unit. Will continue to support and monitor.

## 2019-02-24 NOTE — Progress Notes (Signed)
D: Pt been in room majority of the evening. Pt had minimal interaction on the milieu.  A: Pt was offered support and encouragement. Pt was encourage to attend groups. Q 15 minute checks were done for safety.  R: safety maintained on unit.

## 2019-02-25 LAB — CBC WITH DIFFERENTIAL/PLATELET
Abs Immature Granulocytes: 0.01 10*3/uL (ref 0.00–0.07)
Basophils Absolute: 0 10*3/uL (ref 0.0–0.1)
Basophils Relative: 1 %
Eosinophils Absolute: 0.1 10*3/uL (ref 0.0–0.5)
Eosinophils Relative: 2 %
HCT: 45.6 % (ref 39.0–52.0)
Hemoglobin: 15 g/dL (ref 13.0–17.0)
Immature Granulocytes: 0 %
Lymphocytes Relative: 37 %
Lymphs Abs: 2.2 10*3/uL (ref 0.7–4.0)
MCH: 30.7 pg (ref 26.0–34.0)
MCHC: 32.9 g/dL (ref 30.0–36.0)
MCV: 93.3 fL (ref 80.0–100.0)
Monocytes Absolute: 0.3 10*3/uL (ref 0.1–1.0)
Monocytes Relative: 6 %
Neutro Abs: 3.3 10*3/uL (ref 1.7–7.7)
Neutrophils Relative %: 54 %
Platelets: 199 10*3/uL (ref 150–400)
RBC: 4.89 MIL/uL (ref 4.22–5.81)
RDW: 11.7 % (ref 11.5–15.5)
WBC: 6 10*3/uL (ref 4.0–10.5)
nRBC: 0 % (ref 0.0–0.2)

## 2019-02-25 LAB — BASIC METABOLIC PANEL
Anion gap: 8 (ref 5–15)
BUN: 12 mg/dL (ref 6–20)
CO2: 29 mmol/L (ref 22–32)
Calcium: 8.9 mg/dL (ref 8.9–10.3)
Chloride: 103 mmol/L (ref 98–111)
Creatinine, Ser: 0.85 mg/dL (ref 0.61–1.24)
GFR calc Af Amer: >60 mL/min (ref >60)
GFR calc non Af Amer: >60 mL/min (ref >60)
Glucose, Bld: 91 mg/dL (ref 70–99)
Potassium: 4.3 mmol/L (ref 3.5–5.1)
Sodium: 140 mmol/L (ref 135–145)

## 2019-02-25 LAB — LIPID PANEL
Cholesterol: 143 mg/dL (ref 0–200)
HDL: 37 mg/dL — ABNORMAL LOW (ref >40)
LDL Cholesterol: 93 mg/dL (ref 0–99)
Total CHOL/HDL Ratio: 3.9 RATIO
Triglycerides: 66 mg/dL (ref <150)
VLDL: 13 mg/dL (ref 0–40)

## 2019-02-25 LAB — TSH: TSH: 0.725 u[IU]/mL (ref 0.350–4.500)

## 2019-02-25 LAB — HEMOGLOBIN A1C
Hgb A1c MFr Bld: 5.1 % (ref 4.8–5.6)
Mean Plasma Glucose: 99.67 mg/dL

## 2019-02-25 MED ORDER — LAMOTRIGINE 100 MG PO TABS: 100.0000 mg | ORAL_TABLET | Freq: Every day | ORAL | Status: DC

## 2019-02-25 MED ORDER — ARIPIPRAZOLE 10 MG PO TABS
10.0000 mg | ORAL_TABLET | Freq: Every day | ORAL | Status: DC
  Administered 2019-02-25 – 2019-02-26 (×2): 10 mg via ORAL
  Filled 2019-02-25 (×2): qty 1
  Filled 2019-02-25 (×2): qty 7
  Filled 2019-02-25: qty 1

## 2019-02-25 MED ORDER — SERTRALINE HCL 100 MG PO TABS
100.0000 mg | ORAL_TABLET | ORAL | Status: DC
  Administered 2019-02-26 – 2019-02-27 (×2): 100 mg via ORAL
  Filled 2019-02-25 (×2): qty 1
  Filled 2019-02-25 (×2): qty 7
  Filled 2019-02-25 (×2): qty 1

## 2019-02-25 MED ORDER — TRAZODONE HCL 100 MG PO TABS
100.0000 mg | ORAL_TABLET | Freq: Every evening | ORAL | Status: DC | PRN
  Administered 2019-02-25: 100 mg via ORAL
  Filled 2019-02-25: qty 1

## 2019-02-25 MED ORDER — SERTRALINE HCL 100 MG PO TABS: 100.0000 mg | ORAL_TABLET | Freq: Every day | ORAL | Status: DC

## 2019-02-25 MED ORDER — ARIPIPRAZOLE 10 MG PO TABS: 10.0000 mg | ORAL_TABLET | Freq: Every day | ORAL | Status: DC

## 2019-02-25 MED ORDER — LAMOTRIGINE 25 MG PO TABS
125.0000 mg | ORAL_TABLET | Freq: Every day | ORAL | Status: DC
  Administered 2019-02-25 – 2019-02-26 (×2): 125 mg via ORAL
  Filled 2019-02-25 (×4): qty 1

## 2019-02-25 MED ORDER — LAMOTRIGINE 100 MG PO TABS
100.0000 mg | ORAL_TABLET | Freq: Every day | ORAL | Status: DC
  Filled 2019-02-25 (×2): qty 1

## 2019-02-25 MED ORDER — SERTRALINE HCL 100 MG PO TABS
100.0000 mg | ORAL_TABLET | Freq: Every day | ORAL | Status: DC
  Filled 2019-02-25 (×2): qty 1
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
## 2019-02-25 NOTE — Progress Notes (Signed)
[place] NOVEL CORONAVIRUS (COVID-19) DAILY CHECK-OFF SYMPTOMS - answer yes or no to each - every day NO YES  Have you had a fever in the past 24 hours?  . Fever (Temp > 37.80C / 100F) X   Have you had any of these symptoms in the past 24 hours? . New Cough .  Sore Throat  .  Shortness of Breath .  Difficulty Breathing .  Unexplained Body Aches   X   Have you had any one of these symptoms in the past 24 hours not related to allergies?   . Runny Nose .  Nasal Congestion .  Sneezing   X   If you have had runny nose, nasal congestion, sneezing in the past 24 hours, has it worsened?  X   EXPOSURES - check yes or no X   Have you traveled outside the state in the past 14 days?  X   Have you been in contact with someone with a confirmed diagnosis of COVID-19 or PUI in the past 14 days without wearing appropriate PPE?  X   Have you been living in the same home as a person with confirmed diagnosis of COVID-19 or a PUI (household contact)?    X   Have you been diagnosed with COVID-19?    X              What to do next: Answered NO to all: Answered YES to anything:   Proceed with unit schedule Follow the [facility] Inpatient Flowsheet.   

## 2019-02-25 NOTE — Progress Notes (Signed)
Pt presents with a flat affect and a depressed mood. Pt reports decreased depression and anxiety today. Pt denies SI/HI. Pt denies AVH. Pt reports difficulty sleeping last night because he slept during the day yesterday. Pt denies any concerns regarding his sleep cycle. Pt requested to take his morning medications at bedtime. Medications held and Dr. Parke Poisson made aware in tx team.   Orders reviewed with pt. Verbal support provided. Pt encouraged to attend groups. 15 minute checks performed for safety.   Pt compliant with tx plan.   [place] NOVEL CORONAVIRUS (COVID-19) DAILY CHECK-OFF SYMPTOMS - answer yes or no to each - every day NO YES  Have you had a fever in the past 24 hours?  . Fever (Temp > 37.80C / 100F) X   Have you had any of these symptoms in the past 24 hours? . New Cough .  Sore Throat  .  Shortness of Breath .  Difficulty Breathing .  Unexplained Body Aches   X   Have you had any one of these symptoms in the past 24 hours not related to allergies?   . Runny Nose .  Nasal Congestion .  Sneezing   X   If you have had runny nose, nasal congestion, sneezing in the past 24 hours, has it worsened?  X   EXPOSURES - check yes or no X   Have you traveled outside the state in the past 14 days?  X   Have you been in contact with someone with a confirmed diagnosis of COVID-19 or PUI in the past 14 days without wearing appropriate PPE?  X   Have you been living in the same home as a person with confirmed diagnosis of COVID-19 or a PUI (household contact)?    X   Have you been diagnosed with COVID-19?    X              What to do next: Answered NO to all: Answered YES to anything:   Proceed with unit schedule Follow the [facility] Inpatient Flowsheet.

## 2019-02-25 NOTE — [facility] Group Notes (Signed)
LCSW Group Therapy Note 02/25/2019 3:28 PM  Type of Therapy/Topic: Group Therapy: Feelings about Diagnosis  Participation Level: Active   Description of Group:  This group will allow patients to explore their thoughts and feelings about diagnoses they have received. Patients will be guided to explore their level of understanding and acceptance of these diagnoses. Facilitator will encourage patients to process their thoughts and feelings about the reactions of others to their diagnosis and will guide patients in identifying ways to discuss their diagnosis with significant others in their lives. This group will be process-oriented, with patients participating in exploration of their own experiences, giving and receiving support, and processing challenge from other group members.  Therapeutic Goals: 1. Patient will demonstrate understanding of diagnosis as evidenced by identifying two or more symptoms of the disorder 2. Patient will be able to express two feelings regarding the diagnosis 3. Patient will demonstrate their ability to communicate their needs through discussion and/or role play  Summary of Patient Progress:  Nathan Morton was engaged and participated throughout the group session. Nathan Morton reports that he has struggled with his BPD for many years. He states that he has found new coping skills since doing his own research. He reports he feels confident in his road to recovery once his medications are adjusted.     Therapeutic Modalities:  Cognitive Behavioral Therapy Brief Therapy Feelings Identification    [place] Clinical Social Worker
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
## 2019-02-25 NOTE — Progress Notes (Signed)
Pt attended wrap-up group. He appears animated/silly in affect and mood. He denies SI/HI/AVH at this time. Pt is requesting trazodone with repeat HS; provider on call notified. See MAR. Pt states he is working on relationship with his fiance. Support offered and safety maintained.

## 2019-02-25 NOTE — Tx Team (Signed)
Interdisciplinary Treatment and Diagnostic Plan Update  02/25/2019 Time of Session:  Nathan Morton MRN: [id number]  Principal Diagnosis: <principal problem not specified>  Secondary Diagnoses: Active Problems:   Bipolar 1 disorder, depressed, severe (HCC)   Current Medications:  Current Facility-Administered Medications  Medication Dose Route Frequency Provider Last Rate Last Dose  . acetaminophen (TYLENOL) tablet 650 mg  650 mg Oral Q6H PRN Lindon Romp A, NP      . alum & mag hydroxide-simeth (MAALOX/MYLANTA) 200-200-20 MG/5ML suspension 30 mL  30 mL Oral Q4H PRN Lindon Romp A, NP      . ARIPiprazole (ABILIFY) tablet 10 mg  10 mg Oral Daily Cobos, Myer Peer, MD   Stopped at 02/25/19 [phone]  . hydrOXYzine (ATARAX/VISTARIL) tablet 25 mg  25 mg Oral TID PRN Rozetta Nunnery, NP      . lamoTRIgine (LAMICTAL) tablet 100 mg  100 mg Oral Daily Cobos, Myer Peer, MD   Stopped at 02/25/19 [phone]  . magnesium hydroxide (MILK OF MAGNESIA) suspension 30 mL  30 mL Oral Daily PRN Lindon Romp A, NP      . prazosin (MINIPRESS) capsule 1 mg  1 mg Oral QHS Cobos, Myer Peer, MD   1 mg at 02/24/19 2329  . sertraline (ZOLOFT) tablet 100 mg  100 mg Oral Daily Cobos, Myer Peer, MD   Stopped at 02/25/19 [phone]  . traZODone (DESYREL) tablet 50 mg  50 mg Oral QHS PRN Rozetta Nunnery, NP       PTA Medications: Medications Prior to Admission  Medication Sig Dispense Refill Last Dose  . buPROPion (WELLBUTRIN XL) 150 MG 24 hr tablet Take 1 tablet (150 mg total) by mouth daily. Take 1 tablet by mouth daily for depression. 30 tablet 0   . hydrOXYzine (ATARAX/VISTARIL) 25 MG tablet Take 1 tablet (25 mg total) by mouth 3 (three) times daily as needed for anxiety. 60 tablet 0   . naltrexone (DEPADE) 50 MG tablet Take 1 tablet (50 mg total) by mouth daily. Take 1 tablet by mouth daily for opioid cravings. 30 tablet 0   . clindamycin (CLEOCIN) 150 MG capsule Take 2 capsules (300 mg total) by mouth 4 (four) times daily.  (Patient not taking: Reported on 02/24/2019) 80 capsule 0 Completed Course at Unknown time  . hydrocortisone 1 % ointment Apply 1 application topically 2 (two) times daily. (Patient not taking: Reported on 02/24/2019) 30 g 0 Completed Course at Unknown time    Patient Stressors:    Patient Strengths:    Treatment Modalities: Medication Management, Group therapy, Case management,  1 to 1 session with clinician, Psychoeducation, Recreational therapy.   Physician Treatment Plan for Primary Diagnosis: <principal problem not specified> Long Term Goal(s): Improvement in symptoms so as ready for discharge Improvement in symptoms so as ready for discharge   Short Term Goals: Ability to identify changes in lifestyle to reduce recurrence of condition will improve Ability to verbalize feelings will improve Ability to disclose and discuss suicidal ideas Ability to demonstrate self-control will improve Ability to identify and develop effective coping behaviors will improve  Medication Management: Evaluate patient's response, side effects, and tolerance of medication regimen.  Therapeutic Interventions: 1 to 1 sessions, Unit Group sessions and Medication administration.  Evaluation of Outcomes: Not Met  Physician Treatment Plan for Secondary Diagnosis: Active Problems:   Bipolar 1 disorder, depressed, severe ([place])  Long Term Goal(s): Improvement in symptoms so as ready for discharge Improvement in symptoms so as ready for discharge  Short Term Goals: Ability to identify changes in lifestyle to reduce recurrence of condition will improve Ability to verbalize feelings will improve Ability to disclose and discuss suicidal ideas Ability to demonstrate self-control will improve Ability to identify and develop effective coping behaviors will improve     Medication Management: Evaluate patient's response, side effects, and tolerance of medication regimen.  Therapeutic Interventions: 1 to 1  sessions, Unit Group sessions and Medication administration.  Evaluation of Outcomes: Not Met   RN Treatment Plan for Primary Diagnosis: <principal problem not specified> Long Term Goal(s): Knowledge of disease and therapeutic regimen to maintain health will improve  Short Term Goals: Ability to participate in decision making will improve, Ability to verbalize feelings will improve, Ability to disclose and discuss suicidal ideas, Ability to identify and develop effective coping behaviors will improve and Compliance with prescribed medications will improve  Medication Management: RN will administer medications as ordered by provider, will assess and evaluate patient's response and provide education to patient for prescribed medication. RN will report any adverse and/or side effects to prescribing provider.  Therapeutic Interventions: 1 on 1 counseling sessions, Psychoeducation, Medication administration, Evaluate responses to treatment, Monitor vital signs and CBGs as ordered, Perform/monitor CIWA, COWS, AIMS and Fall Risk screenings as ordered, Perform wound care treatments as ordered.  Evaluation of Outcomes: Not Met   LCSW Treatment Plan for Primary Diagnosis: <principal problem not specified> Long Term Goal(s): Safe transition to appropriate next level of care at discharge, Engage patient in therapeutic group addressing interpersonal concerns.  Short Term Goals: Engage patient in aftercare planning with referrals and resources  Therapeutic Interventions: Assess for all discharge needs, 1 to 1 time with Social worker, Explore available resources and support systems, Assess for adequacy in community support network, Educate family and significant other(s) on suicide prevention, Complete Psychosocial Assessment, Interpersonal group therapy.  Evaluation of Outcomes: Not Met   Progress in Treatment: Attending groups: No. New to unit Participating in groups: No. Taking medication as  prescribed: Yes. Toleration medication: Yes. Family/Significant other contact made: No, will contact:  the patient's fiance' Patient understands diagnosis: Yes. Discussing patient identified problems/goals with staff: Yes. Medical problems stabilized or resolved: Yes. Denies suicidal/homicidal ideation: Yes. Issues/concerns per patient self-inventory: No. Other:   New problem(s) identified: None   New Short Term/Long Term Goal(s):medication stabilization, elimination of SI thoughts, development of comprehensive mental wellness plan.    Patient Goals:    Discharge Plan or Barriers: Patient plans to discharge home with his fiance'. He plans to follow up with [facility] for outpatient medication management and therapy services.   Reason for Continuation of Hospitalization: Depression Hallucinations Mania Medication stabilization  Estimated Length of Stay: 3-5 days   Attendees: Patient: 02/25/2019 10:23 AM  Physician: Dr. Neita Garnet, MD 02/25/2019 10:23 AM  Nursing: Sharl Ma.Viona Gilmore, RN 02/25/2019 10:23 AM  RN Care Manager: 02/25/2019 10:23 AM  Social Worker: Radonna Ricker, [place] 02/25/2019 10:23 AM  Recreational Therapist:  02/25/2019 10:23 AM  Other:  02/25/2019 10:23 AM  Other:  02/25/2019 10:23 AM  Other: 02/25/2019 10:23 AM    Scribe for Treatment Team: Marylee Floras, [place] 02/25/2019 10:23 AM

## 2019-02-25 NOTE — Progress Notes (Signed)
Adult Psychoeducational Group Note  Date:  02/25/2019 Time:  10:29 PM  Group Topic/Focus:  Wrap-Up Group:   The focus of this group is to help patients review their daily goal of treatment and discuss progress on daily workbooks.  Participation Level:  Active  Participation Quality:  Appropriate  Affect:  Appropriate  Cognitive:  Appropriate  Insight: Appropriate  Engagement in Group:  Engaged  Modes of Intervention:  Discussion  Additional Comments:  Pt stated his goal was to get his med dosage increased.  Pt stated he did meet his goal.  Pt rated the day at a 5/10 due to relationship issues.  Nathan Morton 02/25/2019, 10:29 PM

## 2019-02-26 MED ORDER — TRAZODONE HCL 50 MG PO TABS
50.0000 mg | ORAL_TABLET | Freq: Every evening | ORAL | Status: DC | PRN
  Filled 2019-02-26: qty 7

## 2019-02-26 MED ORDER — TRAZODONE HCL 50 MG PO TABS: 50.0000 mg | ORAL_TABLET | Freq: Every evening | ORAL | Status: DC | PRN

## 2019-02-26 NOTE — Progress Notes (Addendum)
Pt just woke up and c/o of nausea and feeling lightheaded per HS meds. Pt given fluids. Will re-check vitals. Pt rolled back to room in wheelchair.

## 2019-02-26 NOTE — Progress Notes (Signed)
   02/26/19 0706  Vital Signs  Temp 98 F (36.7 C)  Temp Source Oral  Pulse Rate (!) 59  Pulse Rate Source Monitor  Resp 20  BP 102/65  BP Location Right Arm  BP Method Automatic  Patient Position (if appropriate) Sitting  Oxygen Therapy  SpO2 100 %

## 2019-02-26 NOTE — Progress Notes (Signed)
Adult Psychoeducational Group Note  Date:  02/26/2019 Time:  11:51 PM  Group Topic/Focus:  Wrap-Up Group:   The focus of this group is to help patients review their daily goal of treatment and discuss progress on daily workbooks.  Participation Level:  Active  Participation Quality:  Appropriate  Affect:  Appropriate  Cognitive:  Appropriate  Insight: Appropriate  Engagement in Group:  Engaged  Modes of Intervention:  Discussion  Additional Comments:  Pt stated his goal was to speak with the doctor about being discharged.  Pt stated he did speak with the doctor about being discharged tomorrow.  Pt did achieve his goal.  Pt rated the day at a 8/10.  Abdallah Hern 02/26/2019, 11:51 PM

## 2019-02-26 NOTE — [facility] Group Notes (Signed)
  [facility] LCSW Group Therapy Note  Date/Time: 02/26/2019 @ 3pm  Type of Therapy/Topic:  Group Therapy:  Emotion Regulation  Participation Level:  Active   Mood: Pleasant  Description of Group:    The purpose of this group is to assist patients in learning to regulate negative emotions and experience positive emotions. Patients will be guided to discuss ways in which they have been vulnerable to their negative emotions. These vulnerabilities will be juxtaposed with experiences of positive emotions or situations, and patients challenged to use positive emotions to combat negative ones. Special emphasis will be placed on coping with negative emotions in conflict situations, and patients will process healthy conflict resolution skills.  Therapeutic Goals: 1. Patient will identify two positive emotions or experiences to reflect on in order to balance out negative emotions:  2. Patient will label two or more emotions that they find the most difficult to experience:  3. Patient will be able to demonstrate positive conflict resolution skills through discussion or role plays:   Summary of Patient Progress:   Patient was engaged and active throughout group therapy. Patient was able to identify and reflect on how he balances out his negative emotions which is using radical acceptance. Patient states that he tries to have his own reasonability and reflected on the situation that brought him to the hospital and how he should have handled his emotions.  Pt's was able to identify two emotions that he finds the most difficult to experience which are guilt and self-hatred. Patient stated that DBT helps him with his emotion regulation.     Therapeutic Modalities:   Cognitive Behavioral Therapy Feelings Identification Dialectical Behavioral Therapy   Ardelle Anton, LCSW

## 2019-02-26 NOTE — Progress Notes (Deleted)
[place] NOVEL CORONAVIRUS (COVID-19) DAILY CHECK-OFF SYMPTOMS - answer yes or no to each - every day NO YES  Have you had a fever in the past 24 hours?  . Fever (Temp > 37.80C / 100F) X   Have you had any of these symptoms in the past 24 hours? . New Cough .  Sore Throat  .  Shortness of Breath .  Difficulty Breathing .  Unexplained Body Aches   X   Have you had any one of these symptoms in the past 24 hours not related to allergies?   . Runny Nose .  Nasal Congestion .  Sneezing   X   If you have had runny nose, nasal congestion, sneezing in the past 24 hours, has it worsened?  X   EXPOSURES - check yes or no X   Have you traveled outside the state in the past 14 days?  X   Have you been in contact with someone with a confirmed diagnosis of COVID-19 or PUI in the past 14 days without wearing appropriate PPE?  X   Have you been living in the same home as a person with confirmed diagnosis of COVID-19 or a PUI (household contact)?    X   Have you been diagnosed with COVID-19?    X              What to do next: Answered NO to all: Answered YES to anything:   Proceed with unit schedule Follow the [facility] Inpatient Flowsheet.   

## 2019-02-26 NOTE — Progress Notes (Signed)
D. Pt presents with an anxious affect- friendly upon approach- calm and cooperative behavior-reports that he is looking forward to discharge tomorrow. Per pt's self inventory, pt rated his depression, hopelessness and anxiety a 2/0/1, respectively.. Pt currently denies SI/HI and AVH  A. Labs and vitals monitored. Pt compliant with medications. Pt supported emotionally and encouraged to express concerns and ask questions.   R. Pt remains safe with 15 minute checks. Will continue POC.

## 2019-02-26 NOTE — Progress Notes (Signed)
D: Pt denies SI/HI/AVH. Pt is pleasant and cooperative. Pt visible on the unit. Pt stated he was better feeling more at peace and coping with things A: Pt was offered support and encouragement. Pt was given scheduled medications. Pt was encourage to attend groups. Q 15 minute checks were done for safety.  R:Pt attends groups and interacts well with peers and staff. Pt is taking medication. Pt has no complaints.Pt receptive to treatment and safety maintained on unit.

## 2019-02-26 NOTE — Progress Notes (Signed)
Nathan Morton NOVEL CORONAVIRUS (COVID-19) DAILY CHECK-OFF SYMPTOMS - answer yes or no to each - every day NO YES  Have you had a fever in the past 24 hours?  . Fever (Temp > 37.80C / 100F) X   Have you had any of these symptoms in the past 24 hours? . New Cough .  Sore Throat  .  Shortness of Breath .  Difficulty Breathing .  Unexplained Body Aches   X   Have you had any one of these symptoms in the past 24 hours not related to allergies?   . Runny Nose .  Nasal Congestion .  Sneezing   X   If you have had runny nose, nasal congestion, sneezing in the past 24 hours, has it worsened?  X   EXPOSURES - check yes or no X   Have you traveled outside the state in the past 14 days?  X   Have you been in contact with someone with a confirmed diagnosis of COVID-19 or PUI in the past 14 days without wearing appropriate PPE?  X   Have you been living in the same home as a person with confirmed diagnosis of COVID-19 or a PUI (household contact)?    X   Have you been diagnosed with COVID-19?    X              What to do next: Answered NO to all: Answered YES to anything:   Proceed with unit schedule Follow the [facility] Inpatient Flowsheet.   
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
## 2019-02-27 MED ORDER — HYDROXYZINE HCL 25 MG PO TABS: 25.0000 mg | ORAL_TABLET | Freq: Three times a day (TID) | ORAL | 0 refills | Status: AC | PRN

## 2019-02-27 MED ORDER — TRAZODONE HCL 50 MG PO TABS: 50.0000 mg | ORAL_TABLET | Freq: Every evening | ORAL | 0 refills | Status: AC | PRN

## 2019-02-27 MED ORDER — PRAZOSIN HCL 1 MG PO CAPS: 1.0000 mg | ORAL_CAPSULE | Freq: Every day | ORAL | 0 refills | Status: AC

## 2019-02-27 MED ORDER — ARIPIPRAZOLE 10 MG PO TABS: 10.0000 mg | ORAL_TABLET | Freq: Every day | ORAL | 0 refills | Status: AC

## 2019-02-27 MED ORDER — LAMOTRIGINE 25 MG PO TABS: 125.0000 mg | ORAL_TABLET | Freq: Every day | ORAL | 0 refills | Status: AC

## 2019-02-27 MED ORDER — LAMOTRIGINE 25 MG PO TABS: 125.0000 mg | ORAL_TABLET | Freq: Every day | ORAL | Status: DC

## 2019-02-27 MED ORDER — SERTRALINE HCL 100 MG PO TABS: 100.0000 mg | ORAL_TABLET | ORAL | 0 refills | Status: AC

## 2019-02-27 NOTE — [facility] Suicide Risk Assessment (Signed)
[facility] Discharge Suicide Risk Assessment   Principal Problem: depression Discharge Diagnoses: Active Problems:   Bipolar 1 disorder, depressed, severe (HCC)   Total Time spent with patient: 30 minutes  Musculoskeletal: Strength & Muscle Tone: within normal limits Gait & Station: normal Patient leans: N/A  Psychiatric Specialty Exam: ROS no headache, no chest pain, no shortness of breath, no fever or chills   Blood pressure 108/75, pulse 79, temperature 98.5 F (36.9 C), resp. rate 20, SpO2 100 %.There is no height or weight on file to calculate BMI.  General Appearance: Well Groomed  Eye Contact::  Good  Speech:  Normal Rate409  Volume:  Normal  Mood:  reports improved mood and states " I feel a lot better"  Affect:  Appropriate and well related   Thought Process:  Linear and Descriptions of Associations: Intact  Orientation:  Other:  fully alert and attentive  Thought Content:  no hallucinations, no delusions   Suicidal Thoughts:  No denies suicidal or self injurious ideations, denies homicidal or violent ideations  Homicidal Thoughts:  No  Memory:  recent and remote grossly intact   Judgement:  Other:  improving  Insight:  improving   Psychomotor Activity:  Normal  Concentration:  Good  Recall:  Good  Fund of Knowledge:Good  Language: Good  Akathisia:  Negative  Handed:  Right  AIMS (if indicated):     Assets:  Desire for Improvement Resilience  Sleep:  Number of Hours: 6.25  Cognition: WNL  ADL's:  Intact   Mental Status Per Nursing Assessment::   On Admission:  Suicidal ideation indicated by patient, Self-harm thoughts  Demographic Factors:  69 male, lives with fiance ,employed   Loss Factors: Reports difficulty adjusting to life following release from prison.  Historical Factors: History of opiate and alcohol use disorder, reports has been diagnosed withborderline personality disorder, bipolar disorder, PTSD   Risk Reduction Factors:   Employed, Living  with another person, especially a relative, Positive social support and Positive coping skills or problem solving skills  Continued Clinical Symptoms:  Alert, attentive, well related, pleasant, mood improved and affect fuller in range, no thought disorder, no suicidal or self injurious ideations, denies homicidal or violent ideations, no hallucinations , no delusions, not internally preoccupied, future oriented, plans to return to work next week. Denies medication side effects. Side effects discussed, including risk of sexual dysfunction on SSRI, metabolic and motor side effects on Abilify, and severe rash risk on Lamotrigine  Behavior on unit in good control.  Cognitive Features That Contribute To Risk:  No gross cognitive deficits noted upon discharge. Is alert , attentive, and oriented x 3   Suicide Risk:  Mild  Follow-up Information    [facility] Follow up on 03/09/2019.   Why: Telephonic hospital follow up appointment is Wednesday, 8/5 at 8:30a.  The provider will contact you for the appt.  Contact information: [address] [place] [address] [phone]           Plan Of Care/Follow-up recommendations:  Activity:  as tolerated Diet:  regular Tests:  NA Other:  See below Patient is expressing readiness for discharge and is leaving unit in good spirits  Plans to return home Follow up as above  Jenne Campus, MD 02/27/2019, 8:03 AM

## 2019-02-27 NOTE — [facility] Suicide Risk Assessment (Signed)
[facility] INPATIENT:  Family/Significant Other Suicide Prevention Education  Suicide Prevention Education:  Education Completed; Nathan Morton,  Patient's fiancee has been identified by the patient as the family member/significant other with whom the patient will be residing, and identified as the person(s) who will aid the patient in the event of a mental health crisis (suicidal ideations/suicide attempt).  With written consent from the patient, the family member/significant other has been provided the following suicide prevention education, prior to the and/or following the discharge of the patient.  The suicide prevention education provided includes the following:  Suicide risk factors  Suicide prevention and interventions  National Suicide Hotline telephone number  [facility] assessment telephone number  [facility] Emergency Assistance [place] and/or Residential Mobile Crisis Unit telephone number  Request made of family/significant other to:  Remove weapons (e.g., guns, rifles, knives), all items previously/currently identified as safety concern.    Remove drugs/medications (over-the-counter, prescriptions, illicit drugs), all items previously/currently identified as a safety concern.  The family member/significant other verbalizes understanding of the suicide prevention education information provided.  The family member/significant other agrees to remove the items of safety concern listed above.  Nathan Morton 02/27/2019, 8:35 AM

## 2019-02-27 NOTE — Progress Notes (Addendum)
D. Pt presents with a flat affect- friendly upon approach- visible in the milieu interacting well with peers. Per pt's self inventory, pt rated his depression, hopelessness and anxiety a 2/0/3, respectively. Pt writes that his most important goal is to "set mental health goals at home" and writes that he will "talk with my fiance about it". Pt reports that he is looking forward to discharge. . Pt currently denies SI/HI and AVH A. Labs and vitals monitored. Pt compliant with medications. Pt supported emotionally and encouraged to express concerns and ask questions.   R. Pt remains safe with 15 minute checks. Will continue POC.

## 2019-02-27 NOTE — Progress Notes (Signed)
[place] NOVEL CORONAVIRUS (COVID-19) DAILY CHECK-OFF SYMPTOMS - answer yes or no to each - every day NO YES  Have you had a fever in the past 24 hours?  . Fever (Temp > 37.80C / 100F) X   Have you had any of these symptoms in the past 24 hours? . New Cough .  Sore Throat  .  Shortness of Breath .  Difficulty Breathing .  Unexplained Body Aches   X   Have you had any one of these symptoms in the past 24 hours not related to allergies?   . Runny Nose .  Nasal Congestion .  Sneezing   X   If you have had runny nose, nasal congestion, sneezing in the past 24 hours, has it worsened?  X   EXPOSURES - check yes or no X   Have you traveled outside the state in the past 14 days?  X   Have you been in contact with someone with a confirmed diagnosis of COVID-19 or PUI in the past 14 days without wearing appropriate PPE?  X   Have you been living in the same home as a person with confirmed diagnosis of COVID-19 or a PUI (household contact)?    X   Have you been diagnosed with COVID-19?    X              What to do next: Answered NO to all: Answered YES to anything:   Proceed with unit schedule Follow the [facility] Inpatient Flowsheet.   

## 2019-02-27 NOTE — Progress Notes (Signed)
Pt discharged to lobby. Pt was stable and appreciative at that time. All papers, samples and prescriptions were given and valuables returned. Verbal understanding expressed. Denies SI/HI and A/VH. Pt given opportunity to express concerns and ask questions.  

## 2019-02-27 NOTE — [facility] Counselor (Signed)
CSW spoke with patient as a part of the discharge process. SPE was completed with patient's fiancee Nathan Morton. Ms. Nathan Morton reports that the patient will return to home he shares with her and that she will provide transportation back their home. The patient states he has an appointment with [facility] on February 28, 2019 however the chart has 03/09/2019. The patient reported that he has the specific of this appointment at his home. Prior to discharge he met the NP who reviewed medications and discharge prescriptions with the patient.    Nathan Pigeon, LCSW

## 2019-02-27 NOTE — Discharge Summary (Addendum)
Physician Discharge Summary Note  Patient:  Nathan FickleMatthew W Morton is an 28 y.o., male MRN:  [id number] DOB:  07/16/1991 Patient phone:  [phone] (home)  Patient address:   [address] [place] [address],  Total Time spent with patient: 15 minutes  Date of Admission:  02/24/2019 Date of Discharge: 02/27/2019 Reason for Admission: Per assessment note:  Nathan ClintonMatthew Morton is a 28 year old male with history of heroin and alcohol use disorder, borderline personality disorder, bipolar disorder, and PTSD, presenting voluntarily for treatment of suicidal ideation with thoughts of cutting himself. He reports remote history of alcohol dependence but denies recent alcohol use. He reports stopping heroin use four months ago due to his fiance threatening to leave him. UDS and other labwork is pending. He reports chronic daily SI and thoughts of self-harm for years but reports these thoughts have worsened with increased depression over the last few weeks. He had gotten in the bathtub with a knife and plan to cut himself, and his fiance insisted that he come to the hospital. He reports mood swings that occur multiple times in the course of a day- "One minute I'll be happy and the next I'll be suicidal." He is unable to identify stressors but does report anxiety related to recent increase in responsibilities. He was released from prison two years ago and now owns a home, is engaged, and is working full-time as a Music therapistcarpenter. He also reports his fiance has her own mental health problems, which causes stress in their relationship. He has been impulsive with money, making it harder for them to pay bills. He has a temper problem and some problems with paranoia. He recently yelled and cursed at his boss after misunderstanding something his boss had said. Recently he was looking through texts on his fiance's phone and saw where she and a friend had been discussing the patient's mental health problems and saying he  needed help. He became enraged and yelled at her. He reports suicidal ideation to cut himself but denies suicidal plan or intent on the unit. He denies HI/AVH  Principal Problem: Bipolar 1 disorder, depressed, severe (HCC) Discharge Diagnoses: Principal Problem:   Bipolar 1 disorder, depressed, severe (HCC)   Past Psychiatric History:   Past Medical History:  Past Medical History:  Diagnosis Date  . Hypertension    History reviewed. No pertinent surgical history. Family History:  Family History  Family history unknown: Yes   Family Psychiatric  History:  Social History:  Social History   Substance and Sexual Activity  Alcohol Use Not Currently     Social History   Substance and Sexual Activity  Drug Use Not Currently   Comment: Patient reports he is in drug recovery    Social History   Socioeconomic History  . Marital status: Single    Spouse name: Not on file  . Number of children: Not on file  . Years of education: Not on file  . Highest education level: Not on file  Occupational History  . Not on file  Social Needs  . Financial resource strain: Not on file  . Food insecurity    Worry: Not on file    Inability: Not on file  . Transportation needs    Medical: Not on file    Non-medical: Not on file  Tobacco Use  . Smoking status: Current Every Day Smoker    Packs/day: 1.00    Types: E-cigarettes  . Smokeless tobacco: Never Used  Substance and Sexual Activity  .  Alcohol use: Not Currently  . Drug use: Not Currently    Comment: Patient reports he is in drug recovery  . Sexual activity: Not on file  Lifestyle  . Physical activity    Days per week: Not on file    Minutes per session: Not on file  . Stress: Not on file  Relationships  . Social Herbalist on phone: Not on file    Gets together: Not on file    Attends religious service: Not on file    Active member of club or organization: Not on file    Attends meetings of clubs or  organizations: Not on file    Relationship status: Not on file  Other Topics Concern  . Not on file  Social History Narrative  . Not on file    Hospital Course:  AXCEL HORSCH was admitted for Bipolar 1 disorder, depressed, severe ([place])  and crisis management.  Pt was treated discharged with the medications listed below under Medication List.  Medical problems were identified and treated as needed.  Home medications were restarted as appropriate.  Improvement was monitored by observation and Lesia Sago 's daily report of symptom reduction.  Emotional and mental status was monitored by daily self-inventory reports completed by Lesia Sago and clinical staff.         Lesia Sago was evaluated by the treatment team for stability and plans for continued recovery upon discharge. MASSAI HANKERSON 's motivation was an integral factor for scheduling further treatment. Employment, transportation, bed availability, health status, family support, and any pending legal issues were also considered during hospital stay. Pt was offered further treatment options upon discharge including but not limited to Residential, Intensive Outpatient, and Outpatient treatment.  Lesia Sago will follow up with the services as listed below under Follow Up Information.     Upon completion of this admission the patient was both mentally and medically stable for discharge denying suicidal/homicidal ideation, auditory/visual/tactile hallucinations, delusional thoughts and paranoia.    Lesia Sago responded well to treatment with Lamictal 125 mg, Zoloft 100 mg and Trazodone 50 mg without adverse effect. Pt demonstrated improvement without reported or observed adverse effects to the point of stability appropriate for outpatient management. Pertinent labs include: Lipid Panel which outpatient follow-up is necessary for lab recheck as mentioned below. Reviewed CBC, CMP, BAL, and UDS; all unremarkable aside from noted  exceptions.   Physical Findings: AIMS: Facial and Oral Movements Muscles of Facial Expression: None, normal Lips and Perioral Area: None, normal Jaw: None, normal Tongue: None, normal,Extremity Movements Upper (arms, wrists, hands, fingers): None, normal Lower (legs, knees, ankles, toes): None, normal, Trunk Movements Neck, shoulders, hips: None, normal, Overall Severity Severity of abnormal movements (highest score from questions above): None, normal Incapacitation due to abnormal movements: None, normal Patient's awareness of abnormal movements (rate only patient's report): No Awareness, Dental Status Current problems with teeth and/or dentures?: No Does patient usually wear dentures?: No  CIWA:    COWS:     Musculoskeletal: Strength & Muscle Tone: within normal limits Gait & Station: normal Patient leans: N/A  Psychiatric Specialty Exam: See SRA by MD Physical Exam  Vitals reviewed. Psychiatric: He has a normal mood and affect.    Review of Systems  All other systems reviewed and are negative.   Blood pressure 108/75, pulse 79, temperature 98.5 F (36.9 C), resp. rate 20, SpO2 100 %.There is no height or weight on file  to calculate BMI.      Has this patient used any form of tobacco in the last 30 days? (Cigarettes, Smokeless Tobacco, Cigars, and/or Pipes) Yes, N/A  Blood Alcohol level:  Lab Results  Component Value Date   ETH <10 07/11/2018    Metabolic Disorder Labs:  Lab Results  Component Value Date   HGBA1C 5.1 02/25/2019   MPG 99.67 02/25/2019   MPG 105.41 07/12/2018   Lab Results  Component Value Date   PROLACTIN 21.8 (H) 06/30/2018   PROLACTIN 26.3 (H) 06/29/2018   Lab Results  Component Value Date   CHOL 143 02/25/2019   TRIG 66 02/25/2019   HDL 37 (L) 02/25/2019   CHOLHDL 3.9 02/25/2019   VLDL 13 02/25/2019   LDLCALC 93 02/25/2019   LDLCALC 81 06/29/2018    See Psychiatric Specialty Exam and Suicide Risk Assessment completed by Attending  Physician prior to discharge.  Discharge destination:  Home  Is patient on multiple antipsychotic therapies at discharge:  No   Has Patient had three or more failed trials of antipsychotic monotherapy by history:  No  Recommended Plan for Multiple Antipsychotic Therapies: NA  Discharge Instructions    Discharge instructions   Complete by: As directed    Patient is instructed to take all prescribed medications as recommended. Report any side effects or adverse reactions to your outpatient psychiatrist. Patient is instructed to abstain from alcohol and illegal drugs while on prescription medications. In the event of worsening symptoms, patient is instructed to call the crisis hotline, 911, or go to the nearest emergency department for evaluation and treatment.     Allergies as of 02/27/2019      Reactions   Adderall [amphetamine-dextroamphetamine] Rash      Medication List    STOP taking these medications   buPROPion 150 MG 24 hr tablet Commonly known as: WELLBUTRIN XL   clindamycin 150 MG capsule Commonly known as: CLEOCIN   hydrocortisone 1 % ointment   naltrexone 50 MG tablet Commonly known as: DEPADE     TAKE these medications     Indication  ARIPiprazole 10 MG tablet Commonly known as: ABILIFY Take 1 tablet (10 mg total) by mouth at bedtime.  Indication: Manic Phase of Manic-Depression   hydrOXYzine 25 MG tablet Commonly known as: ATARAX/VISTARIL Take 1 tablet (25 mg total) by mouth 3 (three) times daily as needed for anxiety.  Indication: Feeling Anxious   lamoTRIgine 25 MG tablet Commonly known as: LAMICTAL Take 5 tablets (125 mg total) by mouth at bedtime.  Indication: Mood   prazosin 1 MG capsule Commonly known as: MINIPRESS Take 1 capsule (1 mg total) by mouth at bedtime.  Indication: Frightening Dreams   sertraline 100 MG tablet Commonly known as: ZOLOFT Take 1 tablet (100 mg total) by mouth every morning. Start taking on: February 28, 2019   Indication: Major Depressive Disorder   traZODone 50 MG tablet Commonly known as: DESYREL Take 1 tablet (50 mg total) by mouth at bedtime as needed for sleep.  Indication: Trouble Sleeping      Follow-up Information    [facility] Follow up on 03/09/2019.   Why: Telephonic hospital follow up appointment is Wednesday, 8/5 at 8:30a.  The provider will contact you for the appt. The patient reports he is expecting a call from [facility] on 02/28/2019. He states he has the information concerning the call at his home. Contact information: [address] [place] [address] [phone]  Follow-up recommendations:  Activity:  as tolerated Diet:  heart healthy  Comments:  Take all medications as prescribed. Keep all follow-up appointments as scheduled.  Do not consume alcohol or use illegal drugs while on prescription medications. Report any adverse effects from your medications to your primary care provider promptly.  In the event of recurrent symptoms or worsening symptoms, call 911, a crisis hotline, or go to the nearest emergency department for evaluation.   Signed: Oneta Rackanika N Lewis, NP 02/27/2019, 9:09 AM  Patient seen face-to-face for psychiatric evaluation, chart reviewed and case discussed with the physician extender and developed treatment plan. Reviewed the information documented and agree with the treatment plan. Thedore MinsMojeed Soliana Kitko, MD

## 2019-03-17 ENCOUNTER — Emergency Department (HOSPITAL_COMMUNITY): Payer: Self-pay

## 2019-03-17 ENCOUNTER — Other Ambulatory Visit: Payer: Self-pay

## 2019-03-17 ENCOUNTER — Emergency Department (HOSPITAL_COMMUNITY)
Admission: EM | Admit: 2019-03-17 | Discharge: 2019-03-17 | Disposition: A | Discharge status: Home / Self Care | Payer: Self-pay | Attending: Emergency Medicine | Admitting: Emergency Medicine

## 2019-03-17 DIAGNOSIS — M7702 Medial epicondylitis, left elbow: Secondary | ICD-10-CM | POA: Insufficient documentation

## 2019-03-17 DIAGNOSIS — F319 Bipolar disorder, unspecified: Secondary | ICD-10-CM | POA: Insufficient documentation

## 2019-03-17 DIAGNOSIS — F17200 Nicotine dependence, unspecified, uncomplicated: Secondary | ICD-10-CM | POA: Insufficient documentation

## 2019-03-17 DIAGNOSIS — Z79899 Other long term (current) drug therapy: Secondary | ICD-10-CM | POA: Insufficient documentation

## 2019-03-17 DIAGNOSIS — M25522 Pain in left elbow: Secondary | ICD-10-CM

## 2019-03-17 NOTE — Discharge Instructions (Signed)
You were seen in the ED

## 2019-03-17 NOTE — ED Triage Notes (Signed)
Pt c/o left elbow pain x 1 month ; denies any injury to the area , no swelling noted ; pt states that the pain sometimes causes him to wake up in the middle of the night with pain

## 2019-03-17 NOTE — ED Notes (Signed)
Patient verbalizes understanding of discharge instructions. Opportunity for questioning and answering were provided.  patient discharged from ED.  

## 2019-03-17 NOTE — ED Provider Notes (Signed)
[facility] Provider Note   CSN: [id number] Arrival date & time: 03/17/19  1628    History   Chief Complaint Chief Complaint  Patient presents with  . Elbow Pain    HPI Nathan Morton is a 28 y.o. male with PMHX HTN who presents to the ED today with gradual onset constant achy left elbow pain x 1 month.  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  He has been taking 800 mg ibuprofen as needed for the pain with mild relief.  Patient wanted to make sure nothing was wrong today.  Denies pain to the shoulder or wrist.  Denies fever chills.  No trauma to the elbow.      Past Medical History:  Diagnosis Date  . Hypertension     Patient Active Problem List   Diagnosis Date Noted  . MDD (major depressive disorder), recurrent severe, without psychosis (HCC) 07/17/2018  . Bipolar I disorder, most recent episode depressed (HCC) 07/16/2018  . CAP (community acquired pneumonia) 07/12/2018  . Sepsis (HCC) 07/12/2018  . ARF (acute renal failure) (HCC) 07/12/2018  . Suicide attempt (HCC)   . Bipolar 1 disorder, depressed, severe (HCC) 06/29/2018  . PTSD (post-traumatic stress disorder) 06/29/2018  . Polysubstance dependence (HCC) 06/29/2018    No past surgical history on file.      Home Medications    Prior to Admission medications   Medication Sig Start Date End Date Taking? Authorizing Provider  ARIPiprazole (ABILIFY) 10 MG tablet Take 1 tablet (10 mg total) by mouth at bedtime. 02/27/19   Aldean BakerSykes, Janet E, NP  hydrOXYzine (ATARAX/VISTARIL) 25 MG tablet Take 1 tablet (25 mg total) by mouth 3 (three) times daily as needed for anxiety. 02/27/19   Aldean BakerSykes, Janet E, NP  lamoTRIgine (LAMICTAL) 25 MG tablet Take 5 tablets (125 mg total) by mouth at bedtime. 02/27/19   Aldean BakerSykes, Janet E, NP  prazosin (MINIPRESS) 1 MG capsule Take 1 capsule (1 mg total) by mouth at bedtime. 02/27/19   Aldean BakerSykes, Janet E, NP  sertraline (ZOLOFT) 100  MG tablet Take 1 tablet (100 mg total) by mouth every Morton. 02/28/19   Aldean BakerSykes, Janet E, NP  traZODone (DESYREL) 50 MG tablet Take 1 tablet (50 mg total) by mouth at bedtime as needed for sleep. 02/27/19   Aldean BakerSykes, Janet E, NP    Family History Family History  Family history unknown: Yes    Social History Social History   Tobacco Use  . Smoking status: Current Every Day Smoker    Packs/day: 1.00    Types: E-cigarettes  . Smokeless tobacco: Never Used  Substance Use Topics  . Alcohol use: Not Currently  . Drug use: Not Currently    Comment: Patient reports he is in drug recovery     Allergies   Adderall [amphetamine-dextroamphetamine]   Review of Systems Review of Systems  Constitutional: Negative for chills and fever.  Musculoskeletal: Positive for arthralgias. Negative for joint swelling.  Skin: Negative for color change.     Physical Exam Updated Vital Signs BP 115/80   Pulse 78   Temp 98.2 F (36.8 C) (Oral)   Resp 18   SpO2 98%   Physical Exam Vitals signs and nursing note reviewed.  Constitutional:      Appearance: He is not ill-appearing.  HENT:     Head: Normocephalic and atraumatic.  Eyes:     Conjunctiva/sclera: Conjunctivae normal.  Cardiovascular:     Rate and Rhythm:  Normal rate and regular rhythm.  Pulmonary:     Effort: Pulmonary effort is normal.     Breath sounds: Normal breath sounds.  Musculoskeletal:     Comments: No obvious swelling to the left elbow. No tenderness to olecranon process to fossa. Pt does have tenderness to medial epicondyle. ROM intact throughout elbow. No tenderness to left wrist or left shoulder. Strength and sensation intact. Good distal pulses.   Skin:    General: Skin is warm and dry.     Coloration: Skin is not jaundiced.  Neurological:     Mental Status: He is alert.      ED Treatments / Results  Labs (all labs ordered are listed, but only abnormal results are displayed) Labs Reviewed - No data to display   EKG None  Radiology Dg Elbow Complete Left  Result Date: 03/17/2019 CLINICAL DATA:  Elbow pain EXAM: LEFT ELBOW - COMPLETE 3+ VIEW COMPARISON:  None. FINDINGS: There is no evidence of fracture, dislocation, or joint effusion. There is no evidence of arthropathy or other focal bone abnormality. Soft tissues are unremarkable. IMPRESSION: Negative. Electronically Signed   By: Donavan Foil M.D.   On: 03/17/2019 18:14    Procedures Procedures (including critical care time)  Medications Ordered in ED Medications - No data to display   Initial Impression / Assessment and Plan / ED Course  I have reviewed the triage vital signs and the nursing notes.  Pertinent labs & imaging results that were available during my care of the patient were reviewed by me and considered in my medical decision making (see chart for details).    28 year old male who presents to the ED with left elbow pain x1 month.  X-rays obtained prior to being seen which were negative.  Physical exam consistent with medial epicondylitis.  Patient has already been taking 800 mg ibuprofen every day.  Advised to continue this every 8 hours as well as Tylenol every 8 hours for the pain.  Have given instructions to buy a brace over-the-counter to help with his pain.  Discussed rice therapy.  [facility].  Stable for discharge.      Final Clinical Impressions(s) / ED Diagnoses   Final diagnoses:  Left elbow pain  Epicondylitis elbow, medial, left    ED Discharge Orders    None       Eustaquio Maize, PA-C 03/17/19 1845    Drenda Freeze, MD 03/18/19 [phone]

## 2019-03-28 ENCOUNTER — Ambulatory Visit (INDEPENDENT_AMBULATORY_CARE_PROVIDER_SITE_OTHER): Payer: Self-pay

## 2019-03-28 ENCOUNTER — Encounter (HOSPITAL_COMMUNITY): Payer: Self-pay

## 2019-03-28 ENCOUNTER — Ambulatory Visit (HOSPITAL_COMMUNITY)
Admission: EM | Admit: 2019-03-28 | Discharge: 2019-03-28 | Disposition: A | Discharge status: Home / Self Care | Payer: Self-pay | Attending: Family Medicine | Admitting: Family Medicine

## 2019-03-28 ENCOUNTER — Other Ambulatory Visit: Payer: Self-pay

## 2019-03-28 DIAGNOSIS — S4991XA Unspecified injury of right shoulder and upper arm, initial encounter: Secondary | ICD-10-CM

## 2019-03-28 DIAGNOSIS — W208XXA Other cause of strike by thrown, projected or falling object, initial encounter: Secondary | ICD-10-CM

## 2019-03-28 MED ORDER — IBUPROFEN 800 MG PO TABS
ORAL_TABLET | ORAL | Status: AC
  Filled 2019-03-28: qty 1

## 2019-03-28 MED ORDER — IBUPROFEN 800 MG PO TABS: 800.0000 mg | ORAL_TABLET | Freq: Three times a day (TID) | ORAL | 0 refills | Status: AC

## 2019-03-28 MED ORDER — IBUPROFEN 800 MG PO TABS
800.0000 mg | ORAL_TABLET | Freq: Once | ORAL | Status: AC
  Administered 2019-03-28: 800 mg via ORAL

## 2019-03-28 NOTE — ED Triage Notes (Addendum)
Pt states he a saw fell 15 feet onto his right arm. Pt states this happened around 11 am today. Pt states he is recovery he dose NOT want any pain meds.

## 2019-03-28 NOTE — ED Provider Notes (Signed)
[facility]    CSN: [id number] Arrival date & time: 03/28/19  1311      History   Chief Complaint Chief Complaint  Patient presents with  . Arm Injury    HPI Nathan Morton is a 28 y.o. male.   HPI  Heavy piece of construction equipment fell 15 feet and hit him on his right forearm.  Very painful.  Here for evaluation.  Swelling.  Slight wound.  He states his tetanus was in the last year.  Normal movement in the wrist and hand.  Normal sensation of the fingertips.  Good cap refill.  Past Medical History:  Diagnosis Date  . Hypertension     Patient Active Problem List   Diagnosis Date Noted  . MDD (major depressive disorder), recurrent severe, without psychosis ([place]) 07/17/2018  . Bipolar I disorder, most recent episode depressed ([place]) 07/16/2018  . CAP (community acquired pneumonia) 07/12/2018  . Sepsis ([place]) 07/12/2018  . ARF (acute renal failure) ([place]) 07/12/2018  . Suicide attempt ([place])   . Bipolar 1 disorder, depressed, severe ([place]) 06/29/2018  . PTSD (post-traumatic stress disorder) 06/29/2018  . Polysubstance dependence ([place]) 06/29/2018    History reviewed. No pertinent surgical history.     Home Medications    Prior to Admission medications   Medication Sig Start Date End Date Taking? Authorizing Provider  ARIPiprazole (ABILIFY) 10 MG tablet Take 1 tablet (10 mg total) by mouth at bedtime. 02/27/19   Connye Burkitt, NP  hydrOXYzine (ATARAX/VISTARIL) 25 MG tablet Take 1 tablet (25 mg total) by mouth 3 (three) times daily as needed for anxiety. 02/27/19   Connye Burkitt, NP  ibuprofen (ADVIL) 800 MG tablet Take 1 tablet (800 mg total) by mouth 3 (three) times daily. 03/28/19   Raylene Everts, MD  lamoTRIgine (LAMICTAL) 25 MG tablet Take 5 tablets (125 mg total) by mouth at bedtime. 02/27/19   Connye Burkitt, NP  prazosin (MINIPRESS) 1 MG capsule Take 1 capsule (1 mg total) by mouth at bedtime. 02/27/19   Connye Burkitt, NP  sertraline (ZOLOFT) 100  MG tablet Take 1 tablet (100 mg total) by mouth every morning. 02/28/19   Connye Burkitt, NP  traZODone (DESYREL) 50 MG tablet Take 1 tablet (50 mg total) by mouth at bedtime as needed for sleep. 02/27/19   Connye Burkitt, NP    Family History Family History  Family history unknown: Yes    Social History Social History   Tobacco Use  . Smoking status: Current Every Day Smoker    Packs/day: 1.00    Types: E-cigarettes  . Smokeless tobacco: Never Used  Substance Use Topics  . Alcohol use: Not Currently  . Drug use: Not Currently    Comment: Patient reports he is in drug recovery     Allergies   Adderall [amphetamine-dextroamphetamine]   Review of Systems Review of Systems  Constitutional: Negative for chills and fever.  HENT: Negative for ear pain and sore throat.   Eyes: Negative for pain and visual disturbance.  Respiratory: Negative for cough and shortness of breath.   Cardiovascular: Negative for chest pain and palpitations.  Gastrointestinal: Negative for abdominal pain and vomiting.  Genitourinary: Negative for dysuria and hematuria.  Musculoskeletal: Positive for arthralgias. Negative for back pain.  Skin: Negative for color change and rash.  Neurological: Negative for seizures and syncope.  All other systems reviewed and are negative.    Physical Exam Triage Vital Signs ED Triage Vitals  Enc Vitals Group     BP 03/28/19 1411 123/83     Pulse Rate 03/28/19 1411 86     Resp 03/28/19 1411 18     Temp 03/28/19 1411 98.2 F (36.8 C)     Temp Source 03/28/19 1411 Oral     SpO2 03/28/19 1411 100 %     Weight 03/28/19 1408 170 lb (77.1 kg)     Height --      Head Circumference --      Peak Flow --      Pain Score 03/28/19 1408 8     Pain Loc --      Pain Edu? --      Excl. in GC? --    No data found.  Updated Vital Signs BP 123/83 (BP Location: Right Arm)   Pulse 86   Temp 98.2 F (36.8 C) (Oral)   Resp 18   Wt 77.1 kg   SpO2 100%   BMI 24.39  kg/m   Visual Acuity Right Eye Distance:   Left Eye Distance:   Bilateral Distance:    Right Eye Near:   Left Eye Near:    Bilateral Near:     Physical Exam Constitutional:      General: He is not in acute distress.    Appearance: He is well-developed.  HENT:     Head: Normocephalic and atraumatic.  Eyes:     Conjunctiva/sclera: Conjunctivae normal.     Pupils: Pupils are equal, round, and reactive to light.  Neck:     Musculoskeletal: Normal range of motion.  Cardiovascular:     Rate and Rhythm: Normal rate.  Pulmonary:     Effort: Pulmonary effort is normal. No respiratory distress.  Abdominal:     General: There is no distension.     Palpations: Abdomen is soft.  Musculoskeletal: Normal range of motion.       Arms:  Skin:    General: Skin is warm and dry.  Neurological:     Mental Status: He is alert.      UC Treatments / Results  Labs (all labs ordered are listed, but only abnormal results are displayed) Labs Reviewed - No data to display  EKG   Radiology Dg Forearm Right  Result Date: 03/28/2019 CLINICAL DATA:  Right forearm pain after dropping work within on it. EXAM: RIGHT FOREARM - 2 VIEW COMPARISON:  None. FINDINGS: There is no evidence of fracture or other focal bone lesions. Soft tissues are unremarkable. IMPRESSION: Negative. Electronically Signed   By: Obie DredgeWilliam T Derry M.D.   On: 03/28/2019 15:21   Dg Hand Complete Right  Result Date: 03/28/2019 CLINICAL DATA:  Right hand injury today when I saw fell 15 feet onto the patient's hand. Initial encounter. EXAM: RIGHT HAND - COMPLETE 3+ VIEW COMPARISON:  None. FINDINGS: They are is no acute bony or joint abnormality. Remote healed dorsal plate avulsion fracture distal phalanx of the little finger is noted. Soft tissues are unremarkable. No radiopaque foreign body. IMPRESSION: No acute abnormality. Remote healed dorsal plate avulsion fracture base of the distal phalanx of the little finger. Electronically  Signed   By: Drusilla Kannerhomas  Dalessio M.D.   On: 03/28/2019 15:18    Procedures Procedures (including critical care time)  Medications Ordered in UC Medications  ibuprofen (ADVIL) tablet 800 mg (800 mg Oral Given 03/28/19 1558)  ibuprofen (ADVIL) 800 MG tablet (has no administration in time range)    Initial Impression / Assessment and Plan / UC  Course  I have reviewed the triage vital signs and the nursing notes.  Pertinent labs & imaging results that were available during my care of the patient were reviewed by me and considered in my medical decision making (see chart for details).     No fractures.  Bruising and swelling discussed.* Final Clinical Impressions(s) / UC Diagnoses   Final diagnoses:  Injury of right upper extremity, initial encounter     Discharge Instructions     Limit use of arm while painful.  Wear sling for comfort Take ibuprofen 3 times a day with food May gradually increase use of arm as the pain improves   ED Prescriptions    Medication Sig Dispense Auth. Provider   ibuprofen (ADVIL) 800 MG tablet Take 1 tablet (800 mg total) by mouth 3 (three) times daily. 21 tablet Eustace MooreNelson, Yvonne Sue, MD     Controlled Substance Prescriptions [place] Controlled Substance Registry consulted? Not Applicable   Eustace MooreNelson, Yvonne Sue, MD 03/28/19 1901

## 2019-03-28 NOTE — Discharge Instructions (Signed)
Limit use of arm while painful.  Wear sling for comfort Take ibuprofen 3 times a day with food May gradually increase use of arm as the pain improves

## 2020-03-25 IMAGING — CT CT CERVICAL SPINE W/O CM
4 of 7 series · 12 of 33 positions shown, 14 images · non-contrast
Comparison: 10/30/2010.

CLINICAL DATA: Head trauma and neck pain following a fall.

EXAM:
CT HEAD WITHOUT CONTRAST
CT CERVICAL SPINE WITHOUT CONTRAST
TECHNIQUE: Multidetector CT imaging of the head and cervical spine was
performed following the standard protocol without intravenous
contrast. Multiplanar CT image reconstructions of the cervical spine
were also generated.

[Series 9: c spine soft · axial · 0.31mm/px · z∈[-248,-210]mm · 2 of 96 slices shown]
[im 20/96  soft-tissue]
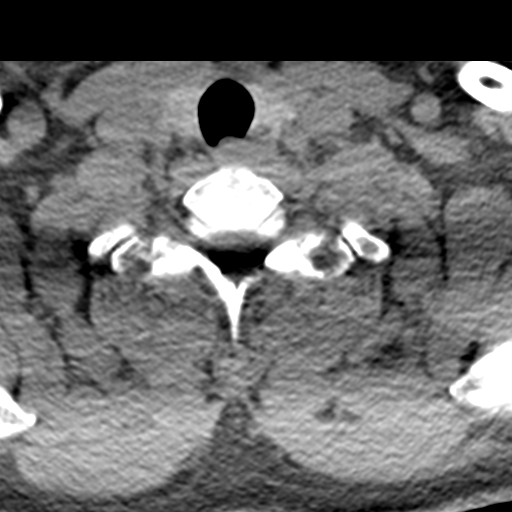
[im 39/96  soft-tissue]
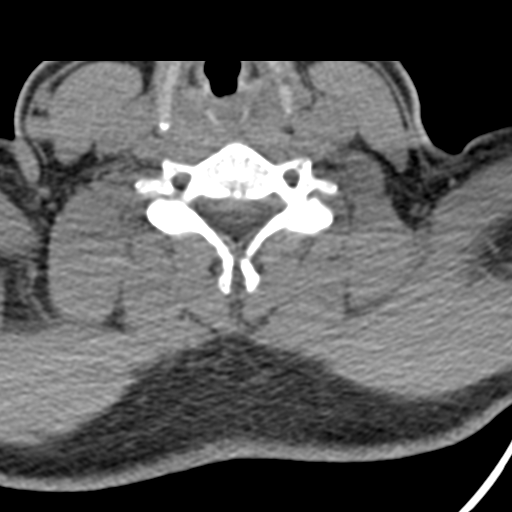

[Series 11: orthogonal bone · axial · 0.26mm/px · z∈[-296,-159]mm · 5 of 111 slices shown, 7 images]
[im 19/111  soft-tissue]
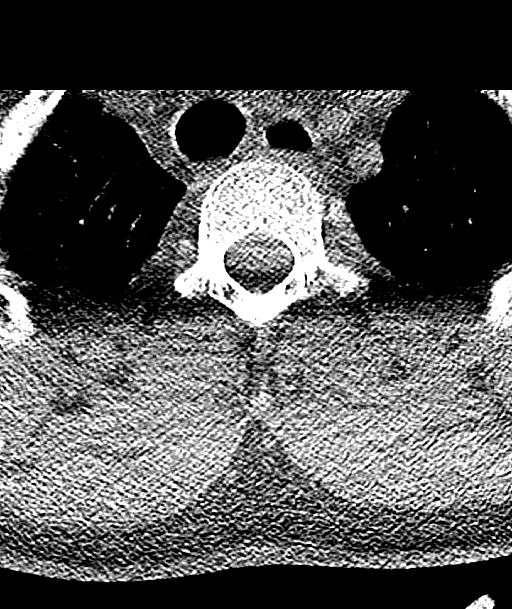
[im 19/111  bone]
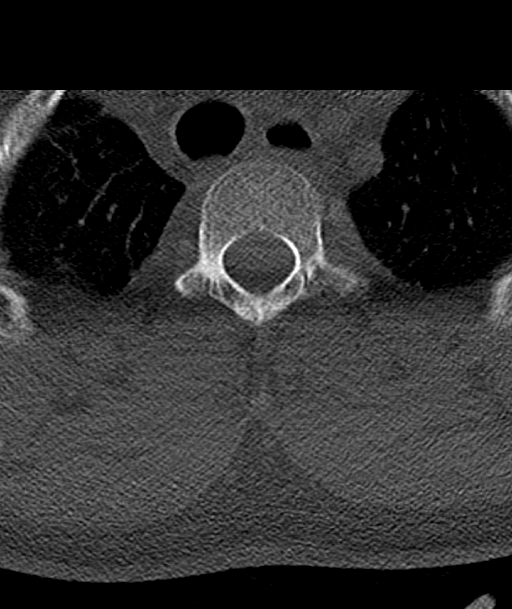
[im 37/111  bone]
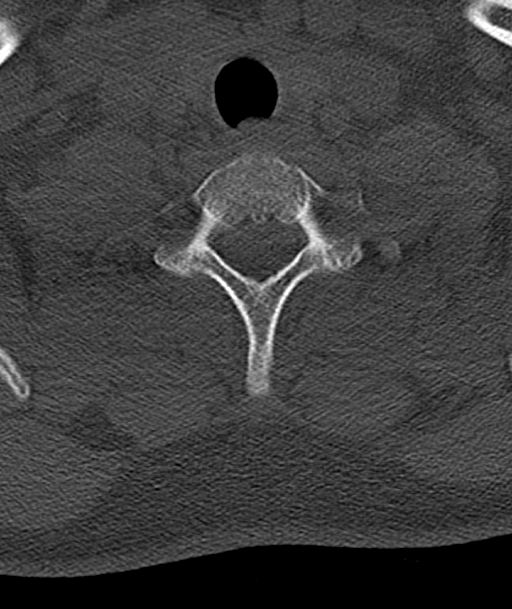
[im 56/111  bone]
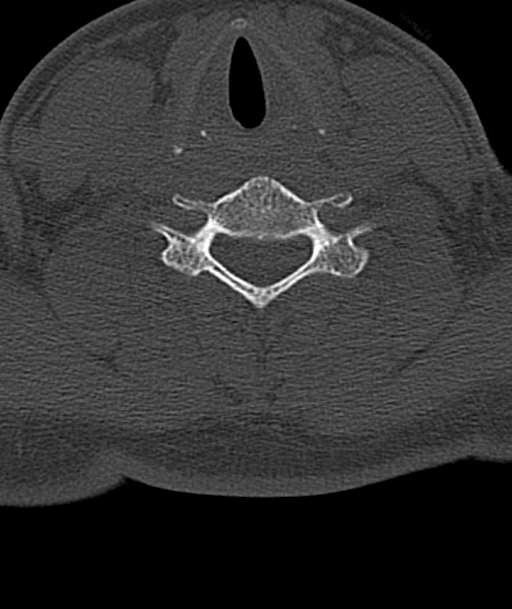
[im 74/111  bone]
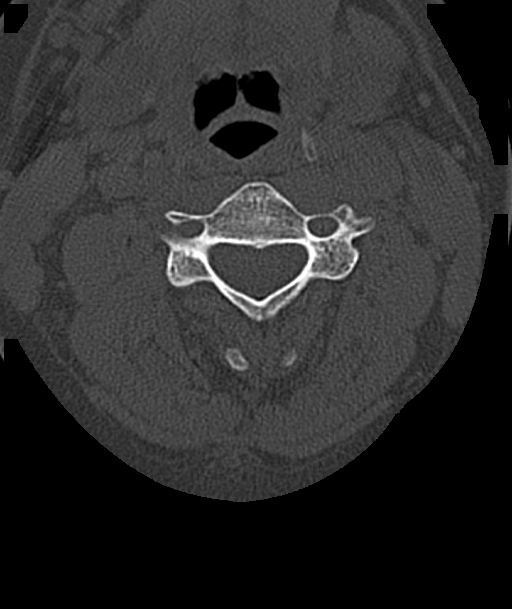
[im 92/111  soft-tissue]
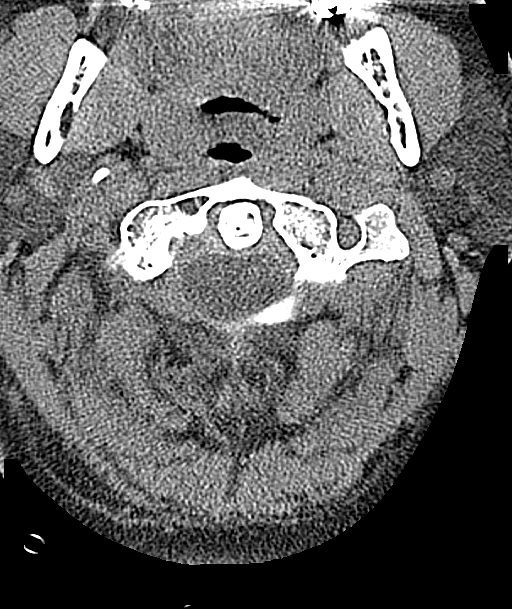
[im 92/111  bone]
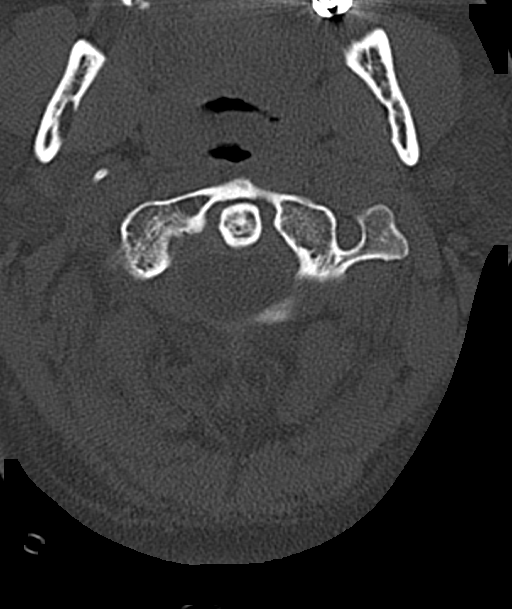

[Series 12: coronal bone · coronal · 0.28mm/px · 1 of 71 slices shown]
[im 36/71  bone]
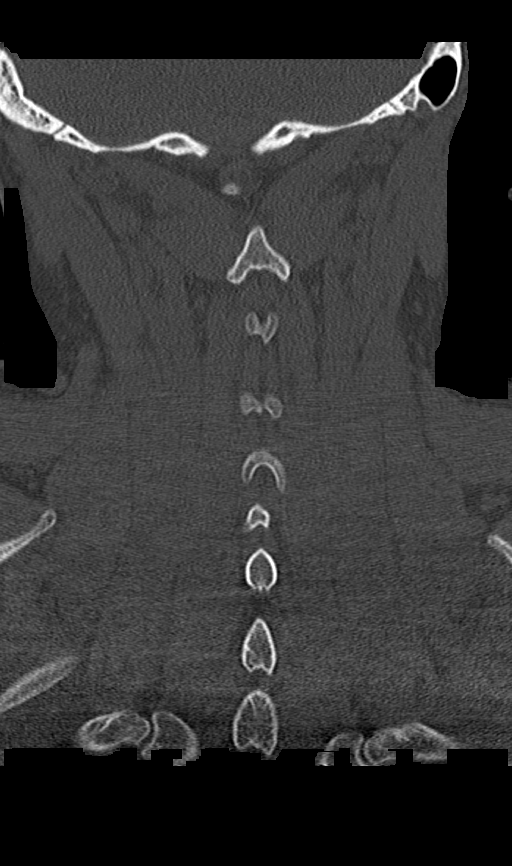

[Series 13: sagittal bone · sagittal · 0.28mm/px · 4 of 61 slices shown]
[im 13/61  bone]
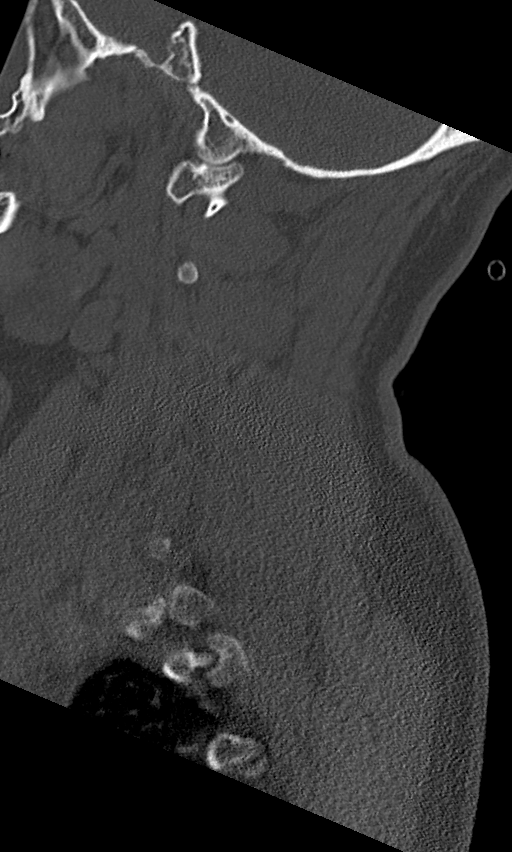
[im 25/61  bone]
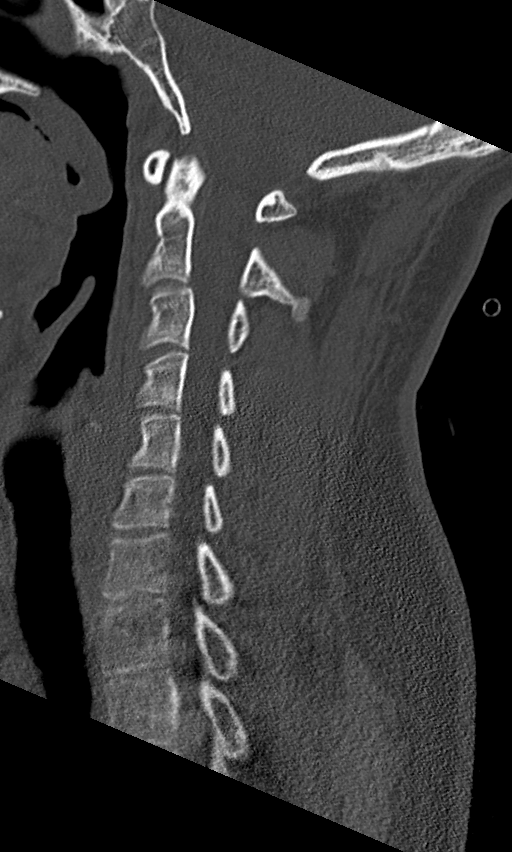
[im 37/61  bone]
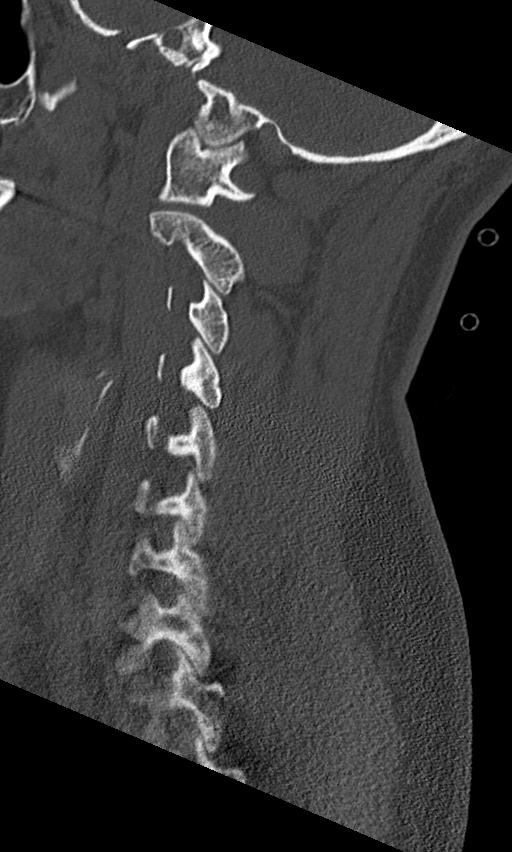
[im 49/61  bone]
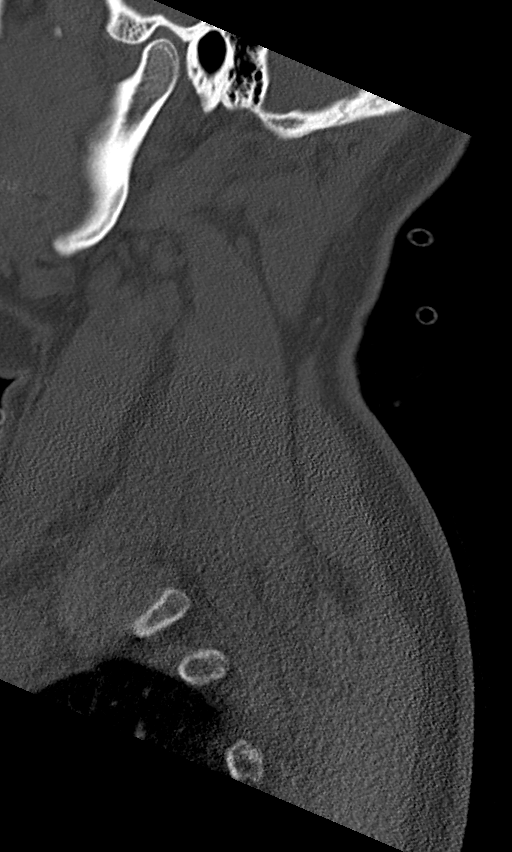

[12 of 33 positions shown; findings below may reference images not displayed]

FINDINGS: CT HEAD FINDINGS

Brain: Normal appearing cerebral hemispheres and posterior fossa
structures. Normal size and position of the ventricles. No
intracranial hemorrhage, mass lesion or CT evidence of acute
infarction.

Vascular: No hyperdense vessel or unexpected calcification.

Skull: Normal. Negative for fracture or focal lesion.

Sinuses/Orbits: Mild to moderate posterior maxillary sinus mucosal
thickening on the right. Unremarkable orbits.

Other: None.

CT CERVICAL SPINE FINDINGS

Alignment: Minimal reversal the normal cervical lordosis with mild
progression and minimal levoconvex cervical scoliosis, not
previously present. No subluxations.

Skull base and vertebrae: No acute fracture. No primary bone lesion
or focal pathologic process.

Soft tissues and spinal canal: No prevertebral fluid or swelling. No
visible canal hematoma.

Disc levels:  Unremarkable.

Upper chest: Interval patchy interstitial prominence at the right
lung apex and minimally at the left lung apex posteriorly.

Other: Choose 1
IMPRESSION: 1. No skull fracture or intracranial hemorrhage.
2. No cervical spine fracture or subluxation.
3. Mild to moderate chronic right maxillary sinusitis.
4. Minimal reversal of the normal cervical lordosis and minimal
levoconvex cervical scoliosis, not previously present.
5. Interval patchy interstitial prominence at the right lung apex
and minimally at the left lung apex posteriorly. This could be due
to pulmonary contusions. This will be described in more detail on
the chest CT report.

## 2020-03-26 IMAGING — US US ABDOMEN LIMITED
1 series · 14 of 25 positions shown · non-contrast
Comparison: CT 07/12/2018

CLINICAL DATA: Abnormal LFTs

EXAM:
ULTRASOUND ABDOMEN LIMITED RIGHT UPPER QUADRANT

[Series 1: us abdomen limited · 14 of 51 slices shown]
[im 1/51]
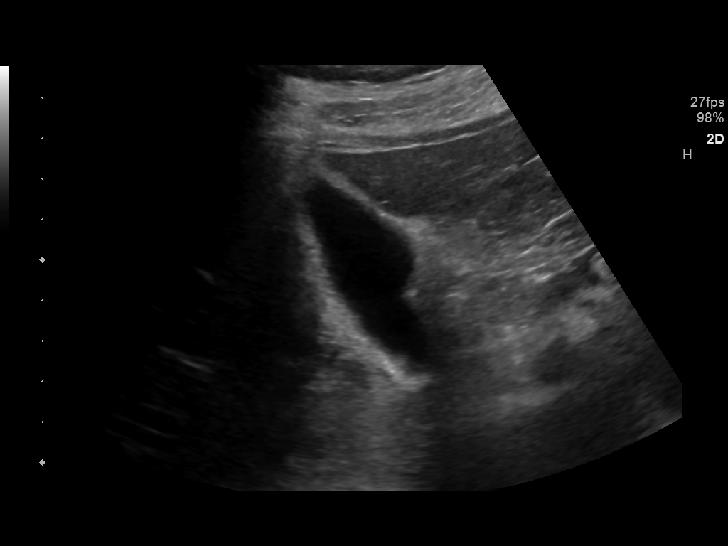
[im 5/51]
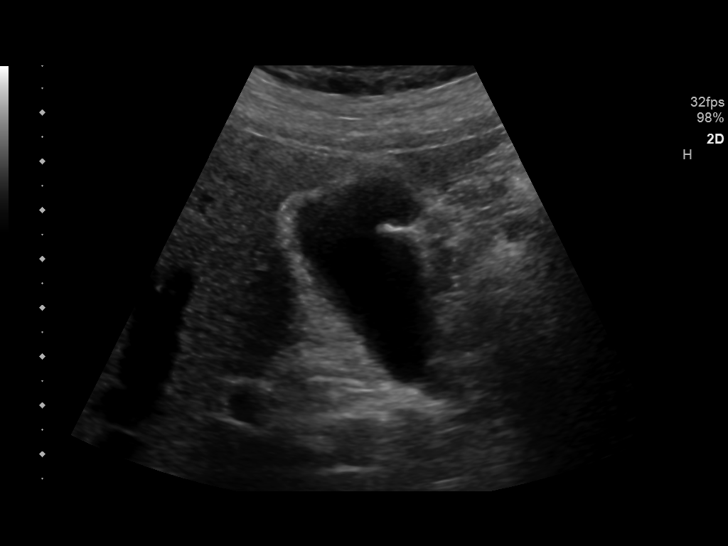
[im 9/51]
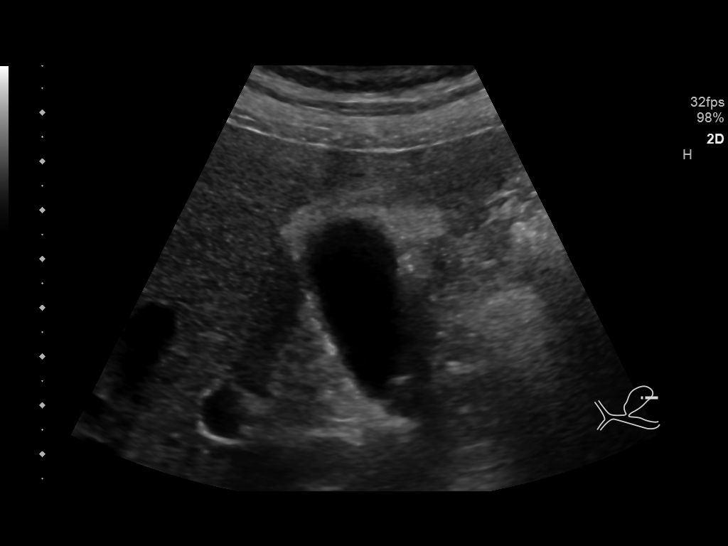
[im 13/51]
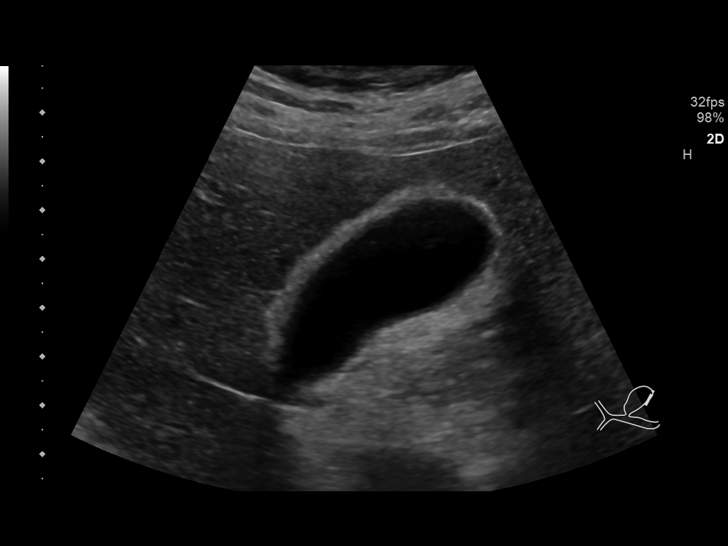
[im 17/51]
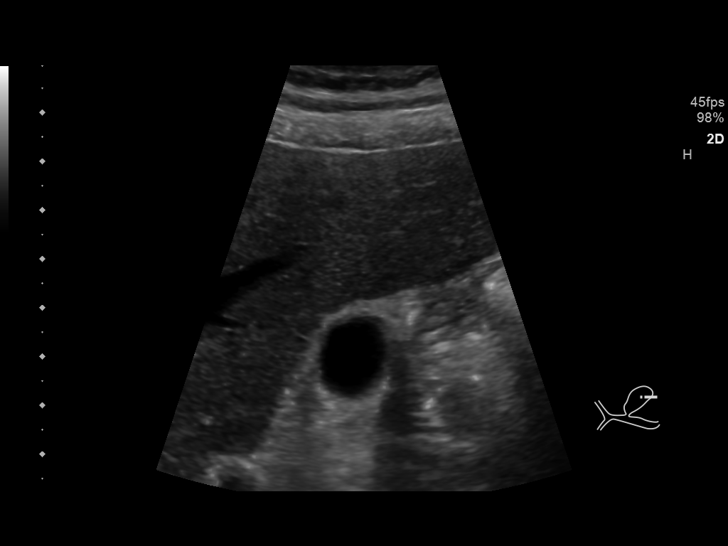
[im 19/51]
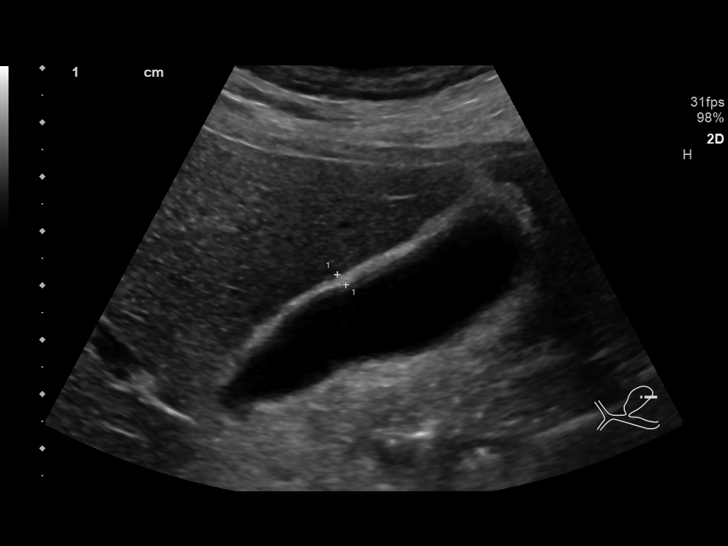
[im 23/51]
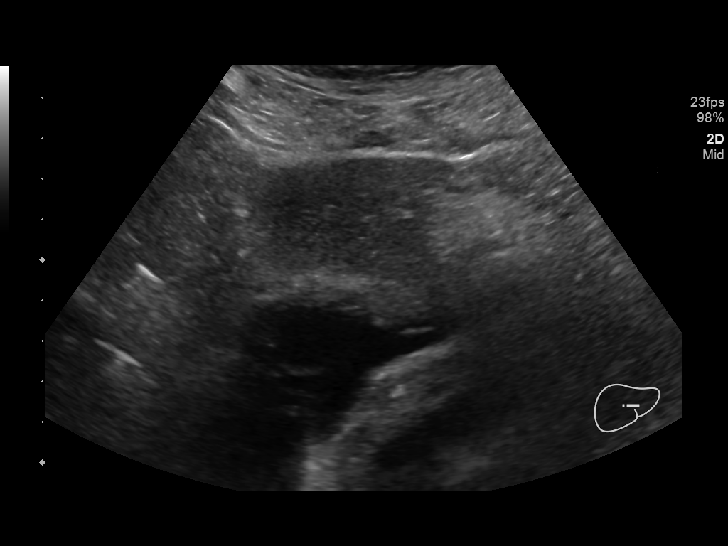
[im 28/51]
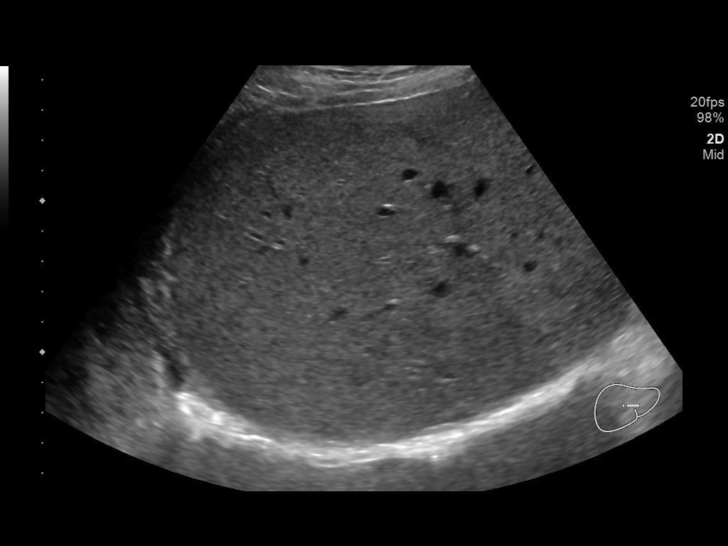
[im 32/51]
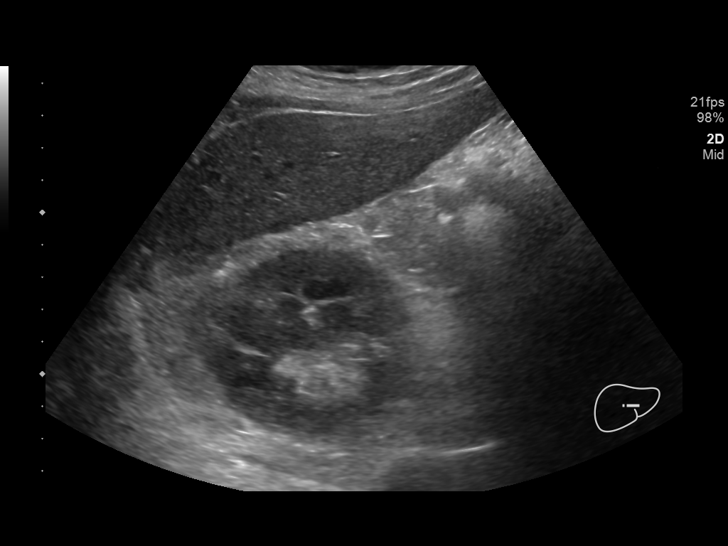
[im 34/51]
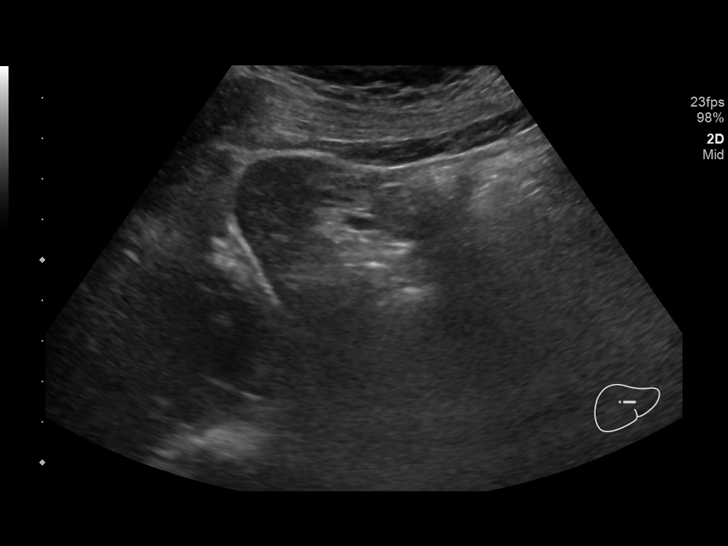
[im 38/51]
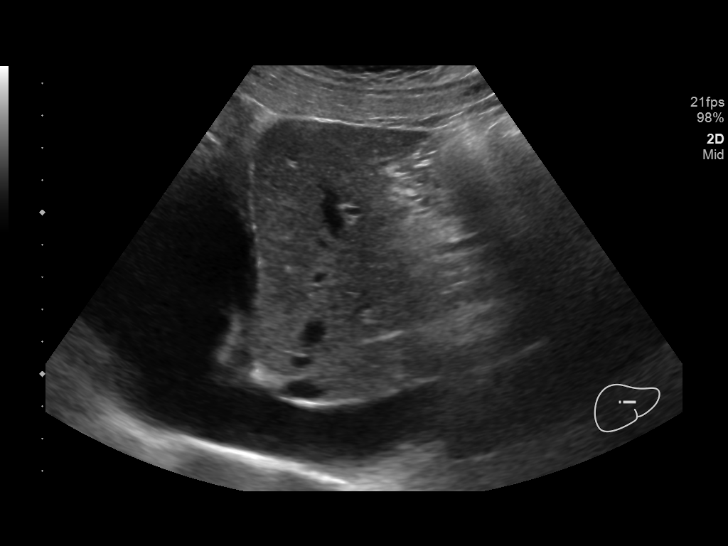
[im 42/51]
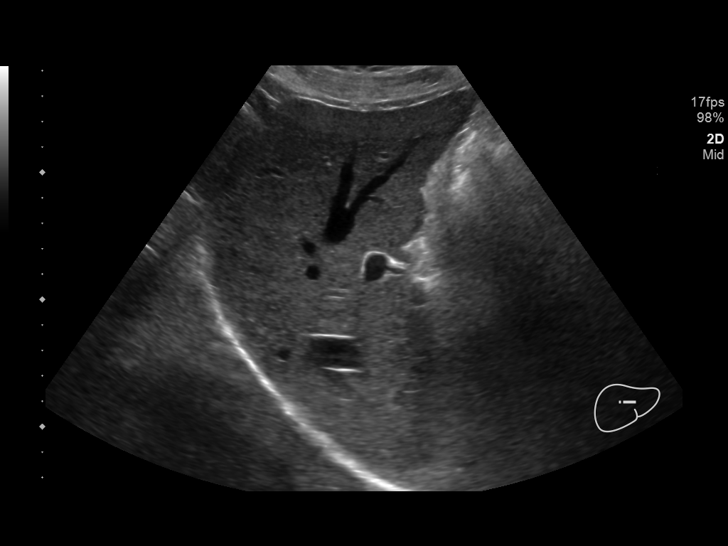
[im 46/51]
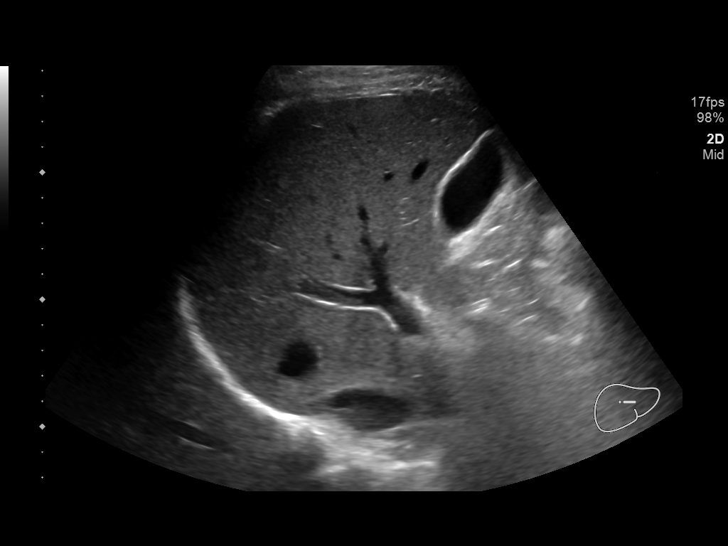
[im 51/51]
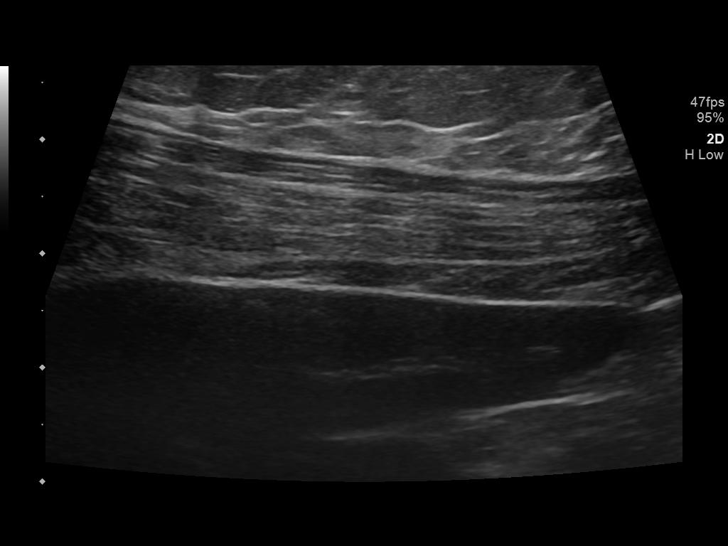

[14 of 25 positions shown; findings below may reference images not displayed]

FINDINGS: Gallbladder:

No gallstones or wall thickening visualized. No sonographic Murphy
sign noted by sonographer.

Common bile duct:

Diameter: Normal caliber, 2 mm

Liver:

No focal lesion identified. Within normal limits in parenchymal
echogenicity. Portal vein is patent on color Doppler imaging with
normal direction of blood flow towards the liver.
IMPRESSION: Unremarkable right upper quadrant ultrasound.

## 2024-03-23 ENCOUNTER — Encounter (HOSPITAL_BASED_OUTPATIENT_CLINIC_OR_DEPARTMENT_OTHER): Payer: Self-pay

## 2024-03-23 ENCOUNTER — Emergency Department (HOSPITAL_BASED_OUTPATIENT_CLINIC_OR_DEPARTMENT_OTHER)
Admission: EM | Admit: 2024-03-23 | Discharge: 2024-03-23 | Disposition: A | Discharge status: Home / Self Care | Payer: Self-pay | Attending: Emergency Medicine | Admitting: Emergency Medicine

## 2024-03-23 ENCOUNTER — Other Ambulatory Visit: Payer: Self-pay

## 2024-03-23 DIAGNOSIS — L03116 Cellulitis of left lower limb: Secondary | ICD-10-CM | POA: Insufficient documentation

## 2024-03-23 DIAGNOSIS — M5442 Lumbago with sciatica, left side: Secondary | ICD-10-CM | POA: Insufficient documentation

## 2024-03-23 DIAGNOSIS — W57XXXA Bitten or stung by nonvenomous insect and other nonvenomous arthropods, initial encounter: Secondary | ICD-10-CM | POA: Insufficient documentation

## 2024-03-23 DIAGNOSIS — L0390 Cellulitis, unspecified: Secondary | ICD-10-CM

## 2024-03-23 MED ORDER — METHYLPREDNISOLONE SODIUM SUCC 40 MG IJ SOLR
40.0000 mg | INTRAMUSCULAR | Status: AC
  Administered 2024-03-23: 40 mg via INTRAMUSCULAR
  Filled 2024-03-23: qty 1

## 2024-03-23 MED ORDER — KETOROLAC TROMETHAMINE 15 MG/ML IJ SOLN
15.0000 mg | Freq: Once | INTRAMUSCULAR | Status: AC
  Administered 2024-03-23: 15 mg via INTRAMUSCULAR
  Filled 2024-03-23: qty 1

## 2024-03-23 MED ORDER — DOXYCYCLINE HYCLATE 100 MG PO CAPS: 100.0000 mg | ORAL_CAPSULE | Freq: Two times a day (BID) | ORAL | 0 refills | Status: AC

## 2024-03-23 MED ORDER — LIDOCAINE 5 % EX PTCH
1.0000 | MEDICATED_PATCH | CUTANEOUS | Status: DC
  Administered 2024-03-23: 1 via TRANSDERMAL
  Filled 2024-03-23: qty 1

## 2024-03-23 MED ORDER — DOXYCYCLINE HYCLATE 100 MG PO TABS
100.0000 mg | ORAL_TABLET | Freq: Once | ORAL | Status: AC
  Administered 2024-03-23: 100 mg via ORAL
  Filled 2024-03-23: qty 1

## 2024-03-23 NOTE — Discharge Instructions (Signed)
 You were seen for your back pain and bug bite in the emergency department.   At home, please use over-the-counter Tylenol , ibuprofen , and lidocaine  patches.    For your bug bite take the doxycycline  for 10 days if your Lyme test is positive.  If it is negative take it for 7 days.  Follow-up with your primary doctor in 7 days regarding your visit.  If you do not have a primary care doctor you may follow-up with [facility] which is listed in this packet.  Return immediately to the emergency department if you experience any of the following: Numbness or weakness of your legs, bowel or bladder incontinence, numbness while wiping after pooping or urinating, or any other concerning symptoms.    Thank you for visiting our Emergency Department. It was a pleasure taking care of you today.

## 2024-03-23 NOTE — ED Triage Notes (Signed)
 Patient reports getting bit by an insect at work yesterday. There is a large red area on his left inner thigh and he notes it has been spreading since yesterday. He also reports back pain but notes this is a chronic issue in which he occasionally needs steroids for.

## 2024-03-23 NOTE — ED Provider Notes (Signed)
 [facility] EMERGENCY DEPARTMENT AT [facility] Provider Note   CSN: [id number] Arrival date & time: 03/23/24  2007     Patient presents with: Insect Bite and Back Pain   LUKE FALERO is a 33 y.o. male.   33 year old male history of substance use in remission and chronic back pain with sciatica presents to the emergency department with back pain and an insect bite.  Patient reports that he has longstanding back pain from an old injury.  Over the past month has been worsening.  Acutely worsened yesterday when he stood up from a bent over position.  Radiates down his left leg.  Says his left leg has been numb chronically since his initial injury but no new numbness or saddle anesthesia.  No bowel or bladder incontinence.  No fevers.  No IV drug use or recreational drug use in years.  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  Did not see a tick or any other insects.  No rashes elsewhere.  No joint pains or fevers or chills.       Prior to Admission medications   Medication Sig Start Date End Date Taking? Authorizing Provider  doxycycline  (VIBRAMYCIN ) 100 MG capsule Take 1 capsule (100 mg total) by mouth 2 (two) times daily for 10 days. 03/23/24 04/02/24 Yes Yolande Lamar BROCKS, MD  ARIPiprazole  (ABILIFY ) 10 MG tablet Take 1 tablet (10 mg total) by mouth at bedtime. 02/27/19   Wonda Clarita BRAVO, NP  hydrOXYzine  (ATARAX /VISTARIL ) 25 MG tablet Take 1 tablet (25 mg total) by mouth 3 (three) times daily as needed for anxiety. 02/27/19   Wonda Clarita BRAVO, NP  ibuprofen  (ADVIL ) 800 MG tablet Take 1 tablet (800 mg total) by mouth 3 (three) times daily. 03/28/19   Maranda Jamee Jacob, MD  lamoTRIgine  (LAMICTAL ) 25 MG tablet Take 5 tablets (125 mg total) by mouth at bedtime. 02/27/19   Wonda Clarita BRAVO, NP  prazosin  (MINIPRESS ) 1 MG capsule Take 1 capsule (1 mg total) by mouth at bedtime. 02/27/19   Wonda Clarita BRAVO, NP  sertraline  (ZOLOFT ) 100 MG tablet Take 1 tablet  (100 mg total) by mouth every morning. 02/28/19   Sykes, Janet E, NP  traZODone  (DESYREL ) 50 MG tablet Take 1 tablet (50 mg total) by mouth at bedtime as needed for sleep. 02/27/19   Wonda Clarita BRAVO, NP    Allergies: Adderall [amphetamine-dextroamphetamine]    Review of Systems  Updated Vital Signs BP 133/86   Pulse 94   Temp 98.4 F (36.9 C)   Resp 17   SpO2 98%   Physical Exam Musculoskeletal:     Comments: No spinal midline TTP in cervical, thoracic, or lumbar spine. No stepoffs noted.   Motor: Muscle bulk and tone are normal. Strength is 5/5 in hip flexion, knee flexion and extension, ankle dorsiflexion and plantar flexion bilaterally. Full strength of great toe dorsiflexion bilaterally.  Sensory: Intact sensation to light touch in L2 though S1 dermatomes bilaterally but does say that is somewhat diminished on the left which is chronic   Skin:    Comments: 17 cm diameter area of erythema and induration on left inner thigh.  Does have a central area with an eschar.  No subcutaneous emphysema palpated.     (all labs ordered are listed, but only abnormal results are displayed) Labs Reviewed  LYME DISEASE SEROLOGY W/REFLEX    EKG: None  Radiology: No results found.   Procedures   Medications  Ordered in the ED  lidocaine  (LIDODERM ) 5 % 1 patch (1 patch Transdermal Patch Applied 03/23/24 2128)  doxycycline  (VIBRA -TABS) tablet 100 mg (100 mg Oral Given 03/23/24 2129)  methylPREDNISolone  sodium succinate (SOLU-MEDROL ) 40 mg/mL injection 40 mg (40 mg Intramuscular Given 03/23/24 2130)  ketorolac  (TORADOL ) 15 MG/ML injection 15 mg (15 mg Intramuscular Given 03/23/24 2129)                                    Medical Decision Making Risk Prescription drug management.   KAYLUM SHRUM is a 33 year old male history of substance use in remission and chronic back pain with sciatica presents to the emergency department with back pain and an insect bite.   Initial Ddx:   Sciatica, lumbar radiculopathy, spinal cord compression, spinal epidural abscess, insect bite, Lyme disease  MDM/Course:  Patient presents emergency department with back pain.  Has longstanding history of this.  Does have a history of substance abuse but reports he has not used in years.  Does have some left lower extremity numbness that is chronic.  No bowel or bladder incontinence.  No fevers or chills.  On exam does have decreased sensation to light touch in the left leg but otherwise is neurologically intact.  Suspect that he has acute on chronic sciatica.  He was given Solu-Medrol  as well as Toradol .  Does have a large area of cellulitis on his left thigh that does appear to be consistent with a bug bite and may have some overlying cellulitis.  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  Prescribed 10-day course of doxycycline  in case of erythema migrans which will also treat cellulitis  This patient presents to the ED for concern of complaints listed in HPI, this involves an extensive number of treatment options, and is a complaint that carries with it a high risk of complications and morbidity. Disposition including potential need for admission considered.   Dispo: DC Home. Return precautions discussed including, but not limited to, those listed in the AVS. Allowed pt time to ask questions which were answered fully prior to dc.  Additional history obtained from brother Records reviewed Outpatient Clinic Notes I have reviewed the patients home medications and made adjustments as needed  Portions of this note were generated with Dragon dictation software. Dictation errors may occur despite best attempts at proofreading.     Final diagnoses:  Left-sided low back pain with left-sided sciatica, unspecified chronicity  Bug bite, initial encounter  Cellulitis, unspecified cellulitis site    ED Discharge Orders          Ordered     doxycycline  (VIBRAMYCIN ) 100 MG capsule  2 times daily        03/23/24 2118               Yolande Lamar BROCKS, MD 03/24/24 0003

## 2024-03-24 ENCOUNTER — Emergency Department (HOSPITAL_BASED_OUTPATIENT_CLINIC_OR_DEPARTMENT_OTHER)
Admission: EM | Admit: 2024-03-24 | Discharge: 2024-03-24 | Disposition: A | Discharge status: Home / Self Care | Payer: Self-pay | Attending: Emergency Medicine | Admitting: Emergency Medicine

## 2024-03-24 ENCOUNTER — Other Ambulatory Visit: Payer: Self-pay

## 2024-03-24 ENCOUNTER — Encounter (HOSPITAL_BASED_OUTPATIENT_CLINIC_OR_DEPARTMENT_OTHER): Payer: Self-pay | Admitting: Emergency Medicine

## 2024-03-24 ENCOUNTER — Other Ambulatory Visit (HOSPITAL_BASED_OUTPATIENT_CLINIC_OR_DEPARTMENT_OTHER): Payer: Self-pay

## 2024-03-24 DIAGNOSIS — W57XXXD Bitten or stung by nonvenomous insect and other nonvenomous arthropods, subsequent encounter: Secondary | ICD-10-CM | POA: Insufficient documentation

## 2024-03-24 DIAGNOSIS — E119 Type 2 diabetes mellitus without complications: Secondary | ICD-10-CM | POA: Insufficient documentation

## 2024-03-24 DIAGNOSIS — L03116 Cellulitis of left lower limb: Secondary | ICD-10-CM | POA: Insufficient documentation

## 2024-03-24 DIAGNOSIS — M5432 Sciatica, left side: Secondary | ICD-10-CM | POA: Insufficient documentation

## 2024-03-24 DIAGNOSIS — Z79899 Other long term (current) drug therapy: Secondary | ICD-10-CM | POA: Insufficient documentation

## 2024-03-24 DIAGNOSIS — I10 Essential (primary) hypertension: Secondary | ICD-10-CM | POA: Insufficient documentation

## 2024-03-24 DIAGNOSIS — G8929 Other chronic pain: Secondary | ICD-10-CM | POA: Insufficient documentation

## 2024-03-24 DIAGNOSIS — S70362D Insect bite (nonvenomous), left thigh, subsequent encounter: Secondary | ICD-10-CM | POA: Insufficient documentation

## 2024-03-24 DIAGNOSIS — F17290 Nicotine dependence, other tobacco product, uncomplicated: Secondary | ICD-10-CM | POA: Insufficient documentation

## 2024-03-24 MED ORDER — HYDROCODONE-ACETAMINOPHEN 5-325 MG PO TABS
1.0000 | ORAL_TABLET | Freq: Four times a day (QID) | ORAL | 0 refills | Status: AC | PRN
  Filled 2024-03-24: qty 14, 4d supply, fill #0

## 2024-03-24 MED ORDER — PREDNISONE 10 MG PO TABS
40.0000 mg | ORAL_TABLET | Freq: Every day | ORAL | 0 refills | Status: AC
  Filled 2024-03-24: qty 20, 5d supply, fill #0

## 2024-03-24 NOTE — ED Provider Notes (Signed)
 [place] EMERGENCY DEPARTMENT AT [facility] Provider Note   CSN: [id number] Arrival date & time: 03/24/24  1221     Patient presents with: Back Pain   Nathan Morton is a 33 y.o. male.   Patient seen last evening.  Treated with Solu-Medrol .  Felt that he had some left sided sciatica acute on chronic back pain.  Also had a tick bite to his left anterior thigh was started on doxycycline  antibiotic does not sound as if patient has started that.  But he did get it filled.  Patient is back here today because of persistent pain.  No new or worse symptoms.  Just not any better.  Past medical history sniffer hypertension past history of tobacco use.  Has been doing e-cigarettes since 2010.  Past history of some substance abuse according to last night's note.  But none recently.  Database shows that he has had no narcotics recently.  Patient states that Earnestine Tuohey left-sided back pain that radiates into the left thigh.  Also had a tick bite to that area with some surrounding cellulitis.  But he feels that the pain really in the leg is not related to that is more related to the back.  Patient denies any incontinence or any leg symptoms to the right side.       Prior to Admission medications   Medication Sig Start Date End Date Taking? Authorizing Provider  HYDROcodone -acetaminophen  (NORCO/VICODIN) 5-325 MG tablet Take 1 tablet by mouth every 6 (six) hours as needed for moderate pain (pain score 4-6). 03/24/24  Yes Zitlali Primm, MD  predniSONE  (DELTASONE ) 10 MG tablet Take 4 tablets (40 mg total) by mouth daily. 03/24/24  Yes Joye Wesenberg, MD  ARIPiprazole  (ABILIFY ) 10 MG tablet Take 1 tablet (10 mg total) by mouth at bedtime. 02/27/19   Wonda Clarita BRAVO, NP  doxycycline  (VIBRAMYCIN ) 100 MG capsule Take 1 capsule (100 mg total) by mouth 2 (two) times daily for 10 days. 03/23/24 04/02/24  Yolande Lamar BROCKS, MD  hydrOXYzine  (ATARAX /VISTARIL ) 25 MG tablet Take 1 tablet (25 mg total) by mouth 3  (three) times daily as needed for anxiety. 02/27/19   Wonda Clarita BRAVO, NP  ibuprofen  (ADVIL ) 800 MG tablet Take 1 tablet (800 mg total) by mouth 3 (three) times daily. 03/28/19   Maranda Jamee Jacob, MD  lamoTRIgine  (LAMICTAL ) 25 MG tablet Take 5 tablets (125 mg total) by mouth at bedtime. 02/27/19   Wonda Clarita BRAVO, NP  prazosin  (MINIPRESS ) 1 MG capsule Take 1 capsule (1 mg total) by mouth at bedtime. 02/27/19   Wonda Clarita BRAVO, NP  sertraline  (ZOLOFT ) 100 MG tablet Take 1 tablet (100 mg total) by mouth every morning. 02/28/19   Sykes, Janet E, NP  traZODone  (DESYREL ) 50 MG tablet Take 1 tablet (50 mg total) by mouth at bedtime as needed for sleep. 02/27/19   Wonda Clarita BRAVO, NP    Allergies: Adderall [amphetamine-dextroamphetamine]    Review of Systems  Constitutional:  Negative for chills and fever.  HENT:  Negative for ear pain and sore throat.   Eyes:  Negative for pain and visual disturbance.  Respiratory:  Negative for cough and shortness of breath.   Cardiovascular:  Negative for chest pain and palpitations.  Gastrointestinal:  Negative for abdominal pain and vomiting.  Genitourinary:  Negative for dysuria and hematuria.  Musculoskeletal:  Positive for back pain. Negative for arthralgias.  Skin:  Negative for color change and rash.  Neurological:  Negative for seizures and syncope.  All other systems reviewed and are negative.   Updated Vital Signs BP (!) 150/89 (BP Location: Right Arm) Comment: Simultaneous filing. User may not have seen previous data. Comment (BP Location): Simultaneous filing. User may not have seen previous data.  Pulse 76 Comment: Simultaneous filing. User may not have seen previous data.  Temp 98.7 F (37.1 C) (Oral) Comment: Simultaneous filing. User may not have seen previous data. Comment (Src): Simultaneous filing. User may not have seen previous data.  Resp 17 Comment: Simultaneous filing. User may not have seen previous data.  Ht 1.753 m (5' 9)   Wt 77.1 kg    SpO2 98% Comment: Simultaneous filing. User may not have seen previous data.  BMI 25.10 kg/m   Physical Exam Vitals and nursing note reviewed.  Constitutional:      General: He is not in acute distress.    Appearance: Normal appearance. He is well-developed. He is not ill-appearing.  HENT:     Head: Normocephalic and atraumatic.  Eyes:     Extraocular Movements: Extraocular movements intact.     Conjunctiva/sclera: Conjunctivae normal.     Pupils: Pupils are equal, round, and reactive to light.  Cardiovascular:     Rate and Rhythm: Normal rate and regular rhythm.     Heart sounds: No murmur heard. Pulmonary:     Effort: Pulmonary effort is normal. No respiratory distress.     Breath sounds: Normal breath sounds.  Abdominal:     Palpations: Abdomen is soft.     Tenderness: There is no abdominal tenderness.  Musculoskeletal:        General: No swelling.     Cervical back: Normal range of motion and neck supple. No rigidity.     Comments: Left anterior thigh with an area of cellulitis or perhaps some hemorrhaging under the skin measuring about 7 cm.  Central area of scab that was where the tick was.  No significant adenopathy.  Distally neurovascularly intact.  Skin:    General: Skin is warm and dry.     Capillary Refill: Capillary refill takes less than 2 seconds.  Neurological:     General: No focal deficit present.     Mental Status: He is alert and oriented to person, place, and time.     Cranial Nerves: No cranial nerve deficit.     Sensory: No sensory deficit.     Motor: No weakness.  Psychiatric:        Mood and Affect: Mood normal.     (all labs ordered are listed, but only abnormal results are displayed) Labs Reviewed - No data to display  EKG: None  Radiology: No results found.   Procedures   Medications Ordered in the ED - No data to display                                  Medical Decision Making Risk Prescription drug management.   I think  patient's left leg pain is probably due to sciatica.  Does have the tick bite concern.  Lyme titers were sent last night was prescribed doxycycline  patient informed very important that he takes his doxycycline  to prevent [facility] spotted fever or Lyme's disease.  The single dose of the steroid last night is not helping much.  Will continue prednisone .  Will give a short course of hydrocodone  to help with pain give referral to orthopedics as well as to wellness clinic  so that he can have primary care doctor follow-up.  Patient nontoxic no acute distress.  Final diagnoses:  Sciatica of left side  Cellulitis of left thigh  Tick bite of left thigh, subsequent encounter    ED Discharge Orders          Ordered    predniSONE  (DELTASONE ) 10 MG tablet  Daily        03/24/24 1324    HYDROcodone -acetaminophen  (NORCO/VICODIN) 5-325 MG tablet  Every 6 hours PRN        03/24/24 1324               Abbigael Detlefsen, MD 03/24/24 1331

## 2024-03-24 NOTE — Discharge Instructions (Signed)
 Make sure you take your doxycycline  as prescribed.  Take the prednisone  to help with inflammation from the sciatica as directed.  Take the hydrocodone  as needed for additional pain relief.  Make an appointment to follow-up with Ortho care.  Information provided above.  Also contact wellness clinic for follow-up so that she can have a primary care doctor.
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
## 2024-03-25 LAB — LYME DISEASE SEROLOGY W/REFLEX: Lyme Total Antibody EIA: NEGATIVE

## 2024-06-07 ENCOUNTER — Emergency Department (HOSPITAL_BASED_OUTPATIENT_CLINIC_OR_DEPARTMENT_OTHER)
Admission: EM | Admit: 2024-06-07 | Discharge: 2024-06-08 | Disposition: A | Payer: Self-pay | Attending: Emergency Medicine | Admitting: Emergency Medicine

## 2024-06-07 ENCOUNTER — Encounter (HOSPITAL_BASED_OUTPATIENT_CLINIC_OR_DEPARTMENT_OTHER): Payer: Self-pay

## 2024-06-07 ENCOUNTER — Other Ambulatory Visit: Payer: Self-pay

## 2024-06-07 ENCOUNTER — Emergency Department (HOSPITAL_BASED_OUTPATIENT_CLINIC_OR_DEPARTMENT_OTHER): Payer: Self-pay

## 2024-06-07 DIAGNOSIS — S60222A Contusion of left hand, initial encounter: Secondary | ICD-10-CM | POA: Insufficient documentation

## 2024-06-07 DIAGNOSIS — S93401A Sprain of unspecified ligament of right ankle, initial encounter: Secondary | ICD-10-CM | POA: Insufficient documentation

## 2024-06-07 DIAGNOSIS — Y92410 Unspecified street and highway as the place of occurrence of the external cause: Secondary | ICD-10-CM | POA: Insufficient documentation

## 2024-06-07 DIAGNOSIS — I10 Essential (primary) hypertension: Secondary | ICD-10-CM | POA: Insufficient documentation

## 2024-06-07 MED ORDER — IBUPROFEN 600 MG PO TABS: 600.0000 mg | ORAL_TABLET | Freq: Four times a day (QID) | ORAL | 0 refills | Status: DC | PRN

## 2024-06-07 MED ORDER — CYCLOBENZAPRINE HCL 5 MG PO TABS: 5.0000 mg | ORAL_TABLET | Freq: Two times a day (BID) | ORAL | 0 refills | Status: DC | PRN

## 2024-06-07 MED ORDER — KETOROLAC TROMETHAMINE 30 MG/ML IJ SOLN
30.0000 mg | Freq: Once | INTRAMUSCULAR | Status: AC
  Administered 2024-06-07: 30 mg via INTRAMUSCULAR
  Filled 2024-06-07: qty 1

## 2024-06-07 MED ORDER — CYCLOBENZAPRINE HCL 5 MG PO TABS
5.0000 mg | ORAL_TABLET | Freq: Once | ORAL | Status: AC
  Administered 2024-06-07: 5 mg via ORAL
  Filled 2024-06-07: qty 1
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
## 2024-06-07 NOTE — ED Notes (Signed)
 ED Provider at bedside.
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
## 2024-06-08 MED ORDER — IBUPROFEN 600 MG PO TABS: 600.0000 mg | ORAL_TABLET | Freq: Four times a day (QID) | ORAL | 0 refills | Status: AC | PRN

## 2024-06-08 MED ORDER — CYCLOBENZAPRINE HCL 5 MG PO TABS: 5.0000 mg | ORAL_TABLET | Freq: Two times a day (BID) | ORAL | 0 refills | Status: AC | PRN

## 2024-06-08 NOTE — ED Notes (Signed)
 Reviewed discharge instructions, medications, and home care with pt. Pt verbalized understanding and had no further questions. Pt exited ED without complications.
